# Patient Record
Sex: Female | Born: 1940 | Race: White | Hispanic: No | Smoking: Never smoker
Health system: Southern US, Community
[De-identification: ages and names within clinical notes are randomized; demographics above are authoritative.]

## PROBLEM LIST (undated history)

## (undated) DIAGNOSIS — Z950 Presence of cardiac pacemaker: Secondary | ICD-10-CM

## (undated) DIAGNOSIS — I441 Atrioventricular block, second degree: Secondary | ICD-10-CM

## (undated) DIAGNOSIS — I639 Cerebral infarction, unspecified: Secondary | ICD-10-CM

## (undated) DIAGNOSIS — R001 Bradycardia, unspecified: Secondary | ICD-10-CM

## (undated) DIAGNOSIS — Z9289 Personal history of other medical treatment: Secondary | ICD-10-CM

## (undated) HISTORY — PX: ABDOMINAL HYSTERECTOMY: SHX81

## (undated) HISTORY — DX: Cerebral infarction, unspecified: I63.9

## (undated) HISTORY — PX: CATARACT EXTRACTION W/ INTRAOCULAR LENS  IMPLANT, BILATERAL: SHX1307

---

## 2016-04-14 ENCOUNTER — Ambulatory Visit (INDEPENDENT_AMBULATORY_CARE_PROVIDER_SITE_OTHER): Payer: Medicare PPO | Admitting: Family Medicine

## 2016-04-14 ENCOUNTER — Encounter: Payer: Self-pay | Admitting: Family Medicine

## 2016-04-14 VITALS — BP 136/79 | HR 69 | Temp 96.7°F | Ht 63.0 in | Wt 191.8 lb

## 2016-04-14 DIAGNOSIS — Z1231 Encounter for screening mammogram for malignant neoplasm of breast: Secondary | ICD-10-CM | POA: Diagnosis not present

## 2016-04-14 DIAGNOSIS — R05 Cough: Secondary | ICD-10-CM

## 2016-04-14 DIAGNOSIS — R471 Dysarthria and anarthria: Secondary | ICD-10-CM | POA: Diagnosis not present

## 2016-04-14 DIAGNOSIS — R29810 Facial weakness: Secondary | ICD-10-CM | POA: Diagnosis not present

## 2016-04-14 DIAGNOSIS — N8110 Cystocele, unspecified: Secondary | ICD-10-CM

## 2016-04-14 DIAGNOSIS — Z1239 Encounter for other screening for malignant neoplasm of breast: Secondary | ICD-10-CM

## 2016-04-14 DIAGNOSIS — R059 Cough, unspecified: Secondary | ICD-10-CM
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
## 2016-04-14 NOTE — Patient Instructions (Signed)
Great to meet you!  Lets follow up in 4-6 weeks, when the MRI results are aavailable we will call  We will call with labs within 1 week.   Continue a daily aspirin

## 2016-04-15 ENCOUNTER — Other Ambulatory Visit: Payer: Medicare PPO

## 2016-04-15 DIAGNOSIS — R29810 Facial weakness: Secondary | ICD-10-CM

## 2016-04-16 LAB — CMP14+EGFR
A/G RATIO: 1.6 (ref 1.2–2.2)
ALK PHOS: 123 IU/L — AB (ref 39–117)
ALT: 14 IU/L (ref 0–32)
AST: 16 IU/L (ref 0–40)
Albumin: 4.2 g/dL (ref 3.5–4.8)
BUN/Creatinine Ratio: 15 (ref 12–28)
BUN: 12 mg/dL (ref 8–27)
Bilirubin Total: 0.7 mg/dL (ref 0.0–1.2)
CO2: 28 mmol/L (ref 18–29)
Calcium: 9.6 mg/dL (ref 8.7–10.3)
Chloride: 103 mmol/L (ref 96–106)
Creatinine, Ser: 0.8 mg/dL (ref 0.57–1.00)
GFR calc Af Amer: 84 mL/min/{1.73_m2} (ref >59)
GFR calc non Af Amer: 73 mL/min/{1.73_m2} (ref >59)
GLOBULIN, TOTAL: 2.6 g/dL (ref 1.5–4.5)
Glucose: 95 mg/dL (ref 65–99)
POTASSIUM: 4.9 mmol/L (ref 3.5–5.2)
SODIUM: 145 mmol/L — AB (ref 134–144)
Total Protein: 6.8 g/dL (ref 6.0–8.5)

## 2016-04-16 LAB — CBC WITH DIFFERENTIAL/PLATELET
BASOS ABS: 0.1 10*3/uL (ref 0.0–0.2)
Basos: 1 %
EOS (ABSOLUTE): 0.4 10*3/uL (ref 0.0–0.4)
Eos: 5 %
Hematocrit: 41.9 % (ref 34.0–46.6)
Hemoglobin: 14.3 g/dL (ref 11.1–15.9)
IMMATURE GRANULOCYTES: 0 %
Immature Grans (Abs): 0 10*3/uL (ref 0.0–0.1)
Lymphocytes Absolute: 2.2 10*3/uL (ref 0.7–3.1)
Lymphs: 26 %
MCH: 31.4 pg (ref 26.6–33.0)
MCHC: 34.1 g/dL (ref 31.5–35.7)
MCV: 92 fL (ref 79–97)
MONOS ABS: 0.6 10*3/uL (ref 0.1–0.9)
Monocytes: 7 %
NEUTROS PCT: 61 %
Neutrophils Absolute: 5.2 10*3/uL (ref 1.4–7.0)
PLATELETS: 238 10*3/uL (ref 150–379)
RBC: 4.56 x10E6/uL (ref 3.77–5.28)
RDW: 12.7 % (ref 12.3–15.4)
WBC: 8.5 10*3/uL (ref 3.4–10.8)

## 2016-04-16 LAB — LIPID PANEL
CHOL/HDL RATIO: 3.6 ratio (ref 0.0–4.4)
Cholesterol, Total: 169 mg/dL (ref 100–199)
HDL: 47 mg/dL (ref >39)
LDL CALC: 102 mg/dL — AB (ref 0–99)
TRIGLYCERIDES: 98 mg/dL (ref 0–149)
VLDL Cholesterol Cal: 20 mg/dL (ref 5–40)

## 2016-05-10 DIAGNOSIS — I639 Cerebral infarction, unspecified: Secondary | ICD-10-CM

## 2016-05-10 HISTORY — DX: Cerebral infarction, unspecified: I63.9

## 2016-05-13 ENCOUNTER — Encounter: Payer: Medicare PPO | Admitting: Obstetrics and Gynecology

## 2016-05-17 ENCOUNTER — Encounter: Payer: Self-pay | Admitting: Family Medicine

## 2016-05-17 ENCOUNTER — Ambulatory Visit (INDEPENDENT_AMBULATORY_CARE_PROVIDER_SITE_OTHER): Payer: Medicare PPO

## 2016-05-17 ENCOUNTER — Ambulatory Visit (INDEPENDENT_AMBULATORY_CARE_PROVIDER_SITE_OTHER): Payer: Medicare PPO | Admitting: Family Medicine

## 2016-05-17 VITALS — BP 140/78 | HR 64 | Temp 97.4°F | Ht 63.0 in | Wt 191.6 lb

## 2016-05-17 DIAGNOSIS — G44229 Chronic tension-type headache, not intractable: Secondary | ICD-10-CM

## 2016-05-17 DIAGNOSIS — Z0000 Encounter for general adult medical examination without abnormal findings: Secondary | ICD-10-CM | POA: Insufficient documentation

## 2016-05-17 DIAGNOSIS — T189XXA Foreign body of alimentary tract, part unspecified, initial encounter: Secondary | ICD-10-CM

## 2016-05-17 DIAGNOSIS — R29810 Facial weakness: Secondary | ICD-10-CM | POA: Diagnosis not present

## 2016-05-17 NOTE — Progress Notes (Signed)
   HPI  Patient presents today for follow-up of facial droop, headaches, blood work  Patient had an onset of right-sided facial droop during a recent stressful situation. She was concerned about stroke but waited 3 months before presenting for this symptom. She's had an MRI ordered but has not been performed yet. No additional episodes.  Patient is having headaches intermittently that come and go without treatment. Lasting less than a few hours. Described as bilateral squeezing type pain extending from the neck up at over. Associated with stress.  Patient has GYN appointment.  Her cough has improved   He should also states that she's recently lost a couple of gold teeth, she requests x-ray to check this out. Her bowels are moving normally, she's tolerating food and fluids normally. She has an appointment with her dentist.  PMH: Smoking status noted ROS: Per HPI  Objective: BP 140/78   Pulse 64   Temp 97.4 F (36.3 C) (Oral)   Ht 5\' 3"  (1.6 m)   Wt 191 lb 9.6 oz (86.9 kg)   BMI 33.94 kg/m  Gen: NAD, alert, cooperative with exam HEENT: NCAT CV: RRR, good S1/S2, no murmur Resp: CTABL, no wheezes, non-labored Ext: No edema, warm Neuro: Alert and oriented, right-sided facial droop, cranial nerves II through XII otherwise intact, strength 5/5 and sensation intact in bilateral upper and lower extremities, 2+ patellar tendon reflexes bilaterally  Assessment and plan:  # Swallowed foreign body Patient with concern for swallowing gold teeth, plain film to evaluate today, she has follow-up with dentistry Plain film does not show foreign body, discuss ed this possibility, possibly moved on.   # Facial droop Stable No other neuro findings, follow-up on MRI which has not been scheduled If prior authorization for MRI is not timely would recommend neuro evaluation No other episodes consistent with or worrisome for TIA or stroke  # Tension headaches Chronic intermittent headaches,  likely stress relatedness Discussed supportive care  # Healthcare maintenance FOBT discussed    Murtis SinkSam Bradshaw, MD [facility] 05/17/2016, 3:14 PM

## 2016-05-17 NOTE — Patient Instructions (Signed)
Great to see you!  We will work on the MRI, if it is not working out as expected we will likely send you to neurology ( a brain doctor).

## 2016-05-20 ENCOUNTER — Other Ambulatory Visit: Payer: Self-pay

## 2016-05-20 DIAGNOSIS — R29810 Facial weakness: Secondary | ICD-10-CM

## 2016-05-21 ENCOUNTER — Encounter: Payer: Medicare PPO | Admitting: Obstetrics and Gynecology

## 2016-06-01 ENCOUNTER — Ambulatory Visit (INDEPENDENT_AMBULATORY_CARE_PROVIDER_SITE_OTHER): Payer: Medicare PPO | Admitting: Family Medicine

## 2016-06-01 VITALS — BP 145/59 | HR 45 | Temp 97.1°F | Ht 63.0 in | Wt 191.0 lb

## 2016-06-01 DIAGNOSIS — I4430 Unspecified atrioventricular block: Secondary | ICD-10-CM

## 2016-06-01 DIAGNOSIS — R079 Chest pain, unspecified: Secondary | ICD-10-CM

## 2016-06-01 NOTE — Progress Notes (Signed)
Subjective:  Patient ID: Holly Chang, female    DOB: 1940-09-19  Age: 76 y.o. MRN: [id number]  CC: chest discomfort on the left side (had chest pain last saturday after eating - history of mini stroke - per pt)   HPI Holly Chang presents for A substernal twitching sensation this morning. There was no pain pressure radiation. She had some right-sided chest pain 3 days ago after eating some suspicious food. She had no exertional component. She does not have a history of exertional chest pain. There has been no radiation. She denies associated shortness of breath nausea and dyspnea. No diaphoresis. Patient states the twitching is gone now. History Holly Chang has a past medical history of Allergy and Cataract.   She has a past surgical history that includes Abdominal hysterectomy and Eye surgery.   Her family history includes Alcohol abuse in her father; Cancer in her paternal uncle; Heart disease in her father.She reports that she has never smoked. She has never used smokeless tobacco. She reports that she does not drink alcohol or use drugs.    ROS Review of Systems  Constitutional: Negative for activity change, appetite change and fever.  HENT: Negative for congestion, rhinorrhea and sore throat.   Eyes: Negative for visual disturbance.  Respiratory: Negative for cough, chest tightness and shortness of breath.   Cardiovascular: Positive for chest pain. Negative for palpitations.       See history of present illness  Gastrointestinal: Positive for abdominal pain (ongoing w/u for vaginal prolapse). Negative for diarrhea and nausea.  Genitourinary: Negative for dysuria.  Musculoskeletal: Negative for arthralgias and myalgias.    Objective:  BP (!) 145/59 (BP Location: Right Arm)   Pulse (!) 45   Temp 97.1 F (36.2 C) (Oral)   Ht 5\' 3"  (1.6 m)   Wt 191 lb (86.6 kg)   SpO2 97%   BMI 33.83 kg/m   BP Readings from Last 3 Encounters:  06/01/16 (!) 145/59  05/17/16 140/78  04/14/16  136/79    Wt Readings from Last 3 Encounters:  06/01/16 191 lb (86.6 kg)  05/17/16 191 lb 9.6 oz (86.9 kg)  04/14/16 191 lb 12.8 oz (87 kg)     Physical Exam  Constitutional: She is oriented to person, place, and time. She appears well-developed and well-nourished. She appears distressed.  HENT:  Head: Normocephalic and atraumatic.  Eyes: Conjunctivae are normal. Pupils are equal, round, and reactive to light.  Neck: Normal range of motion. Neck supple. No thyromegaly present.  Cardiovascular: Normal rate and normal heart sounds.   No murmur heard. Irregular with bradycardia   Pulmonary/Chest: Effort normal and breath sounds normal. No respiratory distress. She has no wheezes. She has no rales.  Abdominal: Soft. Bowel sounds are normal. She exhibits no distension. There is no tenderness.  Musculoskeletal: Normal range of motion.  Lymphadenopathy:    She has no cervical adenopathy.  Neurological: She is alert and oriented to person, place, and time.  Skin: Skin is warm and dry.  Psychiatric: She has a normal mood and affect. Her behavior is normal.    Patient was never admitted.  Assessment & Plan:   Holly Chang was seen today for chest discomfort on the left side.  Diagnoses and all orders for this visit:  Chest pain, unspecified type -     EKG 12-Lead -     Holter monitor - 48 hour; Future -     Ambulatory referral to Cardiology  AV block -  Holter monitor - 48 hour; Future -     Ambulatory referral to Cardiology   Symptoms are less than convincing for myocardial origin. She does have risk factors including age and some evidence for previous mini stroke. Workup for that is incomplete at this time however. She is a nonsmoker. She is not a diabetic. Blood pressure is always been good even though borderline on office checks here.  I am having Holly Chang maintain her cyanocobalamin and Vitamin D3.  Allergies as of 06/01/2016      Reactions   Penicillins Hives        Medication List       Accurate as of 06/01/16  7:43 PM. Always use your most recent med list.          cyanocobalamin 1000 MCG tablet Take 1,000 mcg by mouth daily.   Vitamin D3 1000 units Caps Take by mouth.       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 Cardiology consulted by phone. Recommends office follow-up and Holter monitor.  Follow-up: No Follow-up on file.  Mechele ClaudeWarren Zurii Hewes, M.D.

## 2016-06-04 ENCOUNTER — Other Ambulatory Visit: Payer: Self-pay | Admitting: *Deleted

## 2016-06-04 DIAGNOSIS — R079 Chest pain, unspecified: Secondary | ICD-10-CM

## 2016-06-04 DIAGNOSIS — I4430 Unspecified atrioventricular block: Secondary | ICD-10-CM

## 2016-06-07 ENCOUNTER — Encounter: Payer: Self-pay | Admitting: Cardiology

## 2016-06-07 ENCOUNTER — Ambulatory Visit (INDEPENDENT_AMBULATORY_CARE_PROVIDER_SITE_OTHER): Payer: Medicare PPO | Admitting: Cardiology

## 2016-06-07 VITALS — BP 135/87 | HR 62 | Ht 63.0 in | Wt 191.6 lb

## 2016-06-07 DIAGNOSIS — I459 Conduction disorder, unspecified: Secondary | ICD-10-CM

## 2016-06-07 DIAGNOSIS — R079 Chest pain, unspecified: Secondary | ICD-10-CM | POA: Diagnosis not present

## 2016-06-07 DIAGNOSIS — I441 Atrioventricular block, second degree: Secondary | ICD-10-CM

## 2016-06-07 LAB — COMPREHENSIVE METABOLIC PANEL
ALK PHOS: 91 U/L (ref 33–130)
ALT: 14 U/L (ref 6–29)
AST: 16 U/L (ref 10–35)
Albumin: 4.1 g/dL (ref 3.6–5.1)
BILIRUBIN TOTAL: 1.1 mg/dL (ref 0.2–1.2)
BUN: 13 mg/dL (ref 7–25)
CO2: 27 mmol/L (ref 20–31)
Calcium: 9.7 mg/dL (ref 8.6–10.4)
Chloride: 106 mmol/L (ref 98–110)
Creat: 0.85 mg/dL (ref 0.60–0.93)
Glucose, Bld: 85 mg/dL (ref 65–99)
Potassium: 4.6 mmol/L (ref 3.5–5.3)
SODIUM: 141 mmol/L (ref 135–146)
Total Protein: 6.9 g/dL (ref 6.1–8.1)

## 2016-06-07 LAB — TSH: TSH: 1.06 mIU/L
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
## 2016-06-07 NOTE — Patient Instructions (Signed)
Medication Instructions:  Your physician recommends that you continue on your current medications as directed. Please refer to the Current Medication list given to you today.  If you need a refill on your cardiac medications before your next appointment, please call your pharmacy.  Labwork: CMP,MAG,TSH TODAY AT [facility] ON THE 1ST FLOOR  Testing/Procedures: Your physician has requested that you have a exercise myoview. For further information please visit [email or web address]. Please follow instruction sheet, as given.  Follow-Up: Your physician recommends that you schedule a follow-up appointment in: 3-4 weeks after stresstest   Thank you for choosing [facility] at [facility]!!    Ahmani Daoud, LPN Nada BoozerLaura Ingold, NP

## 2016-06-08 ENCOUNTER — Telehealth: Payer: Self-pay

## 2016-06-08 LAB — MAGNESIUM: Magnesium: 2.2 mg/dL (ref 1.5–2.5)

## 2016-06-08 NOTE — Telephone Encounter (Signed)
I agree, will follow up on MRI already ordered.   Murtis SinkSam Hortensia Duffin, MD [facility] 06/08/2016, 3:49 PM

## 2016-06-08 NOTE — Telephone Encounter (Signed)
Pt's insurance will not approve the MRI to be done here. Her PCP is located in [place] and until she  changes her PCP, we cannot get approval for the MRI. Also referral placed for [facility]. Multiple messages left with patient to return call or make an appointment. Pt has yet to do this

## 2016-06-08 NOTE — Telephone Encounter (Signed)
Patient states that Nada BoozerLaura Ingold, NP recommends that patient gets a MRI asap.

## 2016-06-08 NOTE — Telephone Encounter (Signed)
Patient states that Laura Ingold, NP recommends that patient gets a MRI asap.  

## 2016-06-09 ENCOUNTER — Telehealth (HOSPITAL_COMMUNITY): Payer: Self-pay

## 2016-06-09 NOTE — Telephone Encounter (Signed)
Encounter complete. 

## 2016-06-11 ENCOUNTER — Ambulatory Visit (HOSPITAL_COMMUNITY)
Admission: RE | Admit: 2016-06-11 | Discharge: 2016-06-11 | Disposition: A | Discharge status: Home / Self Care | Payer: Medicare PPO | Source: Ambulatory Visit | Attending: Cardiology | Admitting: Cardiology

## 2016-06-11 DIAGNOSIS — Z8249 Family history of ischemic heart disease and other diseases of the circulatory system: Secondary | ICD-10-CM | POA: Insufficient documentation

## 2016-06-11 DIAGNOSIS — R079 Chest pain, unspecified: Secondary | ICD-10-CM | POA: Insufficient documentation

## 2016-06-11 LAB — MYOCARDIAL PERFUSION IMAGING
CHL CUP NUCLEAR SSS: 3
LV dias vol: 79 mL (ref 46–106)
LVSYSVOL: 32 mL
Peak HR: 97 {beats}/min
Rest HR: 64 {beats}/min
SDS: 0
SRS: 3
TID: 1.3

## 2016-06-11 MED ORDER — REGADENOSON 0.4 MG/5ML IV SOLN
0.4000 mg | Freq: Once | INTRAVENOUS | Status: AC
  Administered 2016-06-11: 0.4 mg via INTRAVENOUS

## 2016-06-11 MED ORDER — TECHNETIUM TC 99M TETROFOSMIN IV KIT
9.5000 | PACK | Freq: Once | INTRAVENOUS | Status: AC | PRN
  Administered 2016-06-11: 9.5 via INTRAVENOUS
  Filled 2016-06-11: qty 10

## 2016-06-11 MED ORDER — AMINOPHYLLINE 25 MG/ML IV SOLN
75.0000 mg | Freq: Once | INTRAVENOUS | Status: AC
  Administered 2016-06-11: 75 mg via INTRAVENOUS

## 2016-06-11 MED ORDER — TECHNETIUM TC 99M TETROFOSMIN IV KIT
32.2000 | PACK | Freq: Once | INTRAVENOUS | Status: AC | PRN
  Administered 2016-06-11: 32.2 via INTRAVENOUS
  Filled 2016-06-11: qty 33

## 2016-06-21 ENCOUNTER — Ambulatory Visit: Payer: Self-pay | Admitting: Cardiology

## 2016-06-24 ENCOUNTER — Ambulatory Visit (INDEPENDENT_AMBULATORY_CARE_PROVIDER_SITE_OTHER): Payer: Medicare PPO | Admitting: Family Medicine

## 2016-06-24 ENCOUNTER — Ambulatory Visit (INDEPENDENT_AMBULATORY_CARE_PROVIDER_SITE_OTHER): Payer: Medicare PPO

## 2016-06-24 VITALS — BP 126/74 | HR 42 | Temp 98.7°F | Ht 63.0 in | Wt 191.0 lb

## 2016-06-24 DIAGNOSIS — R0602 Shortness of breath: Secondary | ICD-10-CM

## 2016-06-24 DIAGNOSIS — R29810 Facial weakness: Secondary | ICD-10-CM | POA: Diagnosis not present

## 2016-06-24 DIAGNOSIS — R42 Dizziness and giddiness: Secondary | ICD-10-CM

## 2016-06-24 NOTE — Progress Notes (Signed)
   HPI  Patient presents today for follow-up of recent illness.  Patient does complain of difficulty getting a full deep breath for about one week. She denies any fever, chills, sweats, or severe cough.  Patient had an onset of right-sided facial droop a few months ago, we attempted to send her for MRI, it turns out there is very much difficulty with her insurance and we could not get this prior authorized. We then tried to send her to neurology, she did not answer her phone for appointments. She states that her phone is not working correctly.  Today the patient has called and changed her insurance while she is in the clinic, we are now considered in network and her MRI has been set up.  In the last 2 weeks she has developed what she describes as "vertigo". She describes dizziness type symptoms that, on for a few minutes and go away. She denies any room spinning sensation or worsening with head turning.  Her right-sided facial droop has not changed, she denies any new weakness, numbness, or tingling.   PMH: Smoking status noted ROS: Per HPI  Objective: BP 126/74 (BP Location: Left Arm)   Pulse (!) 42   Temp 98.7 F (37.1 C) (Oral)   Ht 5\' 3"  (1.6 m)   Wt 191 lb (86.6 kg)   BMI 33.83 kg/m  Gen: NAD, alert, cooperative with exam HEENT: NCAT, right-sided facial droop, flattening of the nasolabial fold on the right side, normal TMs bilaterally CV: RRR, good S1/S2, no murmur Resp: CTABL, no wheezes, non-labored Ext: No edema, warm Neuro: Alert and oriented, right-sided facial droop as above  Chest x-ray today is clear  Assessment and plan:  # Shortness of breath Chest x-ray is clear today, her lung exam is reassuring Reassurance provided, low threshold for follow-up if she develops any persistent cough, fever, or worsening symptoms.  # Facial droop I am very concerned about underlying stroke within the last 2 months. Her dizziness is intermittent and so not clearly associated  with possible stroke. With her insurance changes we have arranged an MRI for tomorrow night and neurology for Monday morning.  # Dizziness Unclear etiology, momentary to a few minutes of unsteadiness type symptom with no room spinning or head turning worsening. Considering facial droop and possible stroke this should be ruled out before extensive other evaluation is undergone.    Orders Placed This Encounter  Procedures  . DG Chest 2 View    Standing Status:   Future    Number of Occurrences:   1    Standing Expiration Date:   08/22/2017    Order Specific Question:   Reason for Exam (SYMPTOM  OR DIAGNOSIS REQUIRED)    Answer:   shortness of breath    Order Specific Question:   Preferred imaging location?    Answer:   Internal    No orders of the defined types were placed in this encounter.   Murtis SinkSam Malon Siddall, MD [facility] 06/24/2016, 11:56 AM

## 2016-06-24 NOTE — Patient Instructions (Signed)
Great to see you!  We are working on the MRI, if it does not work out I think the emergency room is our next step given your dizziness

## 2016-06-25 ENCOUNTER — Ambulatory Visit (HOSPITAL_COMMUNITY)
Admission: RE | Admit: 2016-06-25 | Discharge: 2016-06-25 | Disposition: A | Discharge status: Home / Self Care | Payer: Medicare PPO | Source: Ambulatory Visit | Attending: Family Medicine | Admitting: Family Medicine

## 2016-06-25 DIAGNOSIS — J3489 Other specified disorders of nose and nasal sinuses: Secondary | ICD-10-CM | POA: Insufficient documentation

## 2016-06-25 DIAGNOSIS — R29810 Facial weakness: Secondary | ICD-10-CM

## 2016-06-25 DIAGNOSIS — M2548 Effusion, other site: Secondary | ICD-10-CM | POA: Diagnosis not present

## 2016-06-28 ENCOUNTER — Ambulatory Visit (INDEPENDENT_AMBULATORY_CARE_PROVIDER_SITE_OTHER): Payer: Medicare PPO | Admitting: Neurology

## 2016-06-28 ENCOUNTER — Encounter: Payer: Self-pay | Admitting: Neurology

## 2016-06-28 VITALS — BP 137/76 | HR 63 | Ht 63.0 in | Wt 194.6 lb

## 2016-06-28 DIAGNOSIS — F419 Anxiety disorder, unspecified: Secondary | ICD-10-CM

## 2016-06-28 DIAGNOSIS — R29810 Facial weakness: Secondary | ICD-10-CM | POA: Diagnosis not present

## 2016-06-28 NOTE — Patient Instructions (Addendum)
-   will do MRA and carotid ultrasound to finish stroke work up - continue to follow up with cardiology - Follow up with your primary care physician as scheduled - your MRI showed no stroke and I think many symptoms you have are likely due to stress, anxiety, and depression.  - relaxation and de-stress are the key to fee better.  - recommend psychology referral if needed.  - may consider to discuss with PCP for ENT referral for right mastoid effusion.  - follow up in 2 months.

## 2016-06-29 DIAGNOSIS — F419 Anxiety disorder, unspecified: Secondary | ICD-10-CM | POA: Insufficient documentation
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
## 2016-06-30 ENCOUNTER — Telehealth: Payer: Self-pay | Admitting: Neurology

## 2016-06-30 NOTE — Telephone Encounter (Signed)
I left a voicemail for patient to call back to schedule Dopplar.

## 2016-07-05 ENCOUNTER — Ambulatory Visit (HOSPITAL_COMMUNITY)
Admission: RE | Admit: 2016-07-05 | Discharge: 2016-07-05 | Disposition: A | Discharge status: Home / Self Care | Payer: Medicare PPO | Source: Ambulatory Visit | Attending: Neurology | Admitting: Neurology

## 2016-07-05 DIAGNOSIS — R29810 Facial weakness: Secondary | ICD-10-CM | POA: Insufficient documentation

## 2016-07-07 ENCOUNTER — Telehealth: Payer: Self-pay

## 2016-07-07 NOTE — Telephone Encounter (Signed)
LEft vm for patient to call back about MRi brain results.

## 2016-07-07 NOTE — Telephone Encounter (Signed)
-----   Message from Marvel PlanJindong Xu, MD sent at 07/06/2016 10:43 PM EST ----- Could you please let the patient know that the MRI brain vessel test done recently was negative for brain vessel abnormalities. Please continue current treatment. Thanks.  Marvel PlanJindong Xu, MD PhD Stroke Neurology 07/06/2016 10:43 PM

## 2016-07-08 NOTE — Telephone Encounter (Signed)
LEft vm for patient to call back about results. 

## 2016-07-08 NOTE — Telephone Encounter (Signed)
Pt returned RN's call °
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
## 2016-07-10 NOTE — Telephone Encounter (Signed)
Noted. Thank you.  Marvel PlanJindong Yutaka Holberg, MD PhD Stroke Neurology 07/10/2016 10:21 AM

## 2016-07-12 NOTE — Telephone Encounter (Signed)
Patient called back and she is rescheduled for 07/22/2016 arrive at 11:15 am for 11:30 apt. Carotid Doppler.

## 2016-07-22 ENCOUNTER — Ambulatory Visit: Payer: Medicare PPO

## 2016-07-27 ENCOUNTER — Ambulatory Visit (INDEPENDENT_AMBULATORY_CARE_PROVIDER_SITE_OTHER): Payer: Medicare PPO | Admitting: Cardiovascular Disease

## 2016-07-27 ENCOUNTER — Encounter: Payer: Self-pay | Admitting: Cardiovascular Disease

## 2016-07-27 VITALS — BP 122/68 | HR 80 | Ht 63.0 in | Wt 189.0 lb

## 2016-07-27 DIAGNOSIS — I441 Atrioventricular block, second degree: Secondary | ICD-10-CM | POA: Diagnosis not present

## 2016-07-27 DIAGNOSIS — R072 Precordial pain: Secondary | ICD-10-CM | POA: Diagnosis not present

## 2016-07-27 NOTE — Progress Notes (Signed)
Cardiology Office Note    Date:  07/31/2016   ID:  Holly Chang, DOB 22-Dec-1940, MRN [id number]  PCP:  Kevin Fenton, MD  Cardiologist:   Thurmon Fair, MD   Chief Complaint  Patient presents with  . Appointment    has had some rigth side body and chest discomfort and pains. SHOB, helps to stop and rest. Normal tiredness. no swelling    History of Present Illness:  Holly Chang is a 76 y.o. female presenting in follow-up after workup for chest pain. When she saw Nada Boozer in January her complaint was right-sided chest pain on two occasions and left-sided chest pain on another occasion. Since then she again has had 1 episodes of "very painful" sensation over her entire right side from chest to abdomen to hip.. This happened about 3 days ago at rest and resolves spontaneously. It was hard to get out of it. She denies exertional chest pain or dyspnea. She has not had syncope or palpitations, but has noticed what she calls "vertigo" over the last 2 weeks. After standing up she staggers for a few steps. She has not fallen. She denies focal neurological complaints.   She underwent a Lexiscan Myoview study January 26 was a normal study. She also wore a 24-hour Holter monitor that showed normal sinus rhythm and first-degree AV block. She had occasional periods of 2:1 AV block, but the longest pause was under 2 seconds in duration. Her electrocardiogram on January 29 shows sinus rhythm with incomplete left bundle branch block (QRS 106 ms), QTC 416 ms    Past Medical History:  Diagnosis Date  . Allergy   . Cataract    bilateral  . Stroke [facility])     Past Surgical History:  Procedure Laterality Date  . ABDOMINAL HYSTERECTOMY    . EYE SURGERY     bilateral cataracts    Current Medications: Outpatient Medications Prior to Visit  Medication Sig Dispense Refill  . Cholecalciferol (VITAMIN D3) 1000 units CAPS Take 1,000 Units by mouth daily.     . cyanocobalamin 1000 MCG tablet Take  1,000 mcg by mouth daily.     No facility-administered medications prior to visit.      Allergies:   Penicillins   Social History   Social History  . Marital status: Single    Spouse name: N/A  . Number of children: N/A  . Years of education: N/A   Social History Main Topics  . Smoking status: Never Smoker  . Smokeless tobacco: Never Used  . Alcohol use No  . Drug use: No  . Sexual activity: Not Asked   Other Topics Concern  . None   Social History Narrative  . None     Family History:  The patient's family history includes Alcohol abuse in her father; Cancer in her paternal uncle; Heart disease in her father; Pulmonary fibrosis in her mother.   ROS:   Please see the history of present illness.    ROS All other systems reviewed and are negative.   PHYSICAL EXAM:   VS:  BP 122/68   Pulse 80   Ht 5\' 3"  (1.6 m)   Wt 85.7 kg (189 lb)   SpO2 96%   BMI 33.48 kg/m    GEN: Well nourished, well developed, in no acute distress  HEENT: normal  Neck: no JVD, carotid bruits, or masses Cardiac: RRR; no murmurs, rubs, or gallops,no edema  Respiratory:  clear to auscultation bilaterally, normal work of breathing GI: soft,  nontender, nondistended, + BS MS: no deformity or atrophy  Skin: warm and dry, no rash Neuro:  Alert and Oriented x 3, Strength and sensation are intact Psych: euthymic mood, full affect  Wt Readings from Last 3 Encounters:  07/27/16 85.7 kg (189 lb)  06/28/16 88.3 kg (194 lb 9.6 oz)  06/24/16 86.6 kg (191 lb)      Studies/Labs Reviewed:   EKG:  EKG is not ordered today  Recent Labs: 04/15/2016: Platelets 238 06/07/2016: ALT 14; BUN 13; Creat 0.85; Magnesium 2.2; Potassium 4.6; Sodium 141; TSH 1.06   Lipid Panel    Component Value Date/Time   CHOL 169 04/15/2016 0945   TRIG 98 04/15/2016 0945   HDL 47 04/15/2016 0945   CHOLHDL 3.6 04/15/2016 0945   LDLCALC 102 (H) 04/15/2016 0945    Additional studies/ records that were reviewed today  include:  Notes from Dr. Roda ShuttersXu. Imaging studies: Normal MRI of brain, followed by virtually normal MRA (right PCA branch vessel attenuation).    ASSESSMENT:    1. Precordial pain   2. Second degree atrioventricular block, Mobitz type I      PLAN:  In order of problems listed above:  1. Chest pain: This does not have any features to suggest that is coronary in etiology. It occurs at rest, is positional and varies in location. Other than age and tobacco use, her risk factors for coronary disease are quite benign. She had a reassuring nuclear stress test. 2. 2nd deg AVB, Mobitz I: She has some very subtle findings to suggest infrahisian disease. She does not have symptoms to suggest bradycardia. Her monitor showed a maximum pause of 1.9 seconds. No indication for pacemaker therapy at this time. Would avoid medications with negative chronotropic effect.    Medication Adjustments/Labs and Tests Ordered: Current medicines are reviewed at length with the patient today.  Concerns regarding medicines are outlined above.  Medication changes, Labs and Tests ordered today are listed in the Patient Instructions below. Patient Instructions  Dr Royann Shiversroitoru recommends that you schedule a follow-up appointment in 1 year. You will receive a reminder letter in the mail two months in advance. If you don't receive a letter, please call our office to schedule the follow-up appointment.  If you need a refill on your cardiac medications before your next appointment, please call your pharmacy.  Please call the office if you pass out or have any fainting spells or symptoms.     Signed, Thurmon FairMihai Donelle Baba, MD  07/31/2016 1:03 PM    [facility] [address], [place], [place]  [address] Phone: [phone]; Fax: [phone]

## 2016-07-27 NOTE — Patient Instructions (Signed)
Dr Royann Shiversroitoru recommends that you schedule a follow-up appointment in 1 year. You will receive a reminder letter in the mail two months in advance. If you don't receive a letter, please call our office to schedule the follow-up appointment.  If you need a refill on your cardiac medications before your next appointment, please call your pharmacy.  Please call the office if you pass out or have any fainting spells or symptoms.

## 2016-07-28 ENCOUNTER — Other Ambulatory Visit: Payer: Medicare PPO

## 2016-07-31 DIAGNOSIS — I441 Atrioventricular block, second degree: Secondary | ICD-10-CM | POA: Insufficient documentation

## 2016-08-04 ENCOUNTER — Ambulatory Visit (INDEPENDENT_AMBULATORY_CARE_PROVIDER_SITE_OTHER): Payer: Medicare PPO

## 2016-08-04 DIAGNOSIS — R29810 Facial weakness: Secondary | ICD-10-CM

## 2016-08-18 ENCOUNTER — Telehealth: Payer: Self-pay

## 2016-08-18 NOTE — Telephone Encounter (Signed)
LEft vm for patient to call back about her carotid artery ultrasound results.

## 2016-08-18 NOTE — Telephone Encounter (Signed)
-----   Message from Marvel Plan, MD sent at 08/17/2016 10:54 PM EDT ----- Could you please let the patient know that the carotid artery ultrasound done 2 weeks ago was negative for any carotid narrowing. It is a normal study. No concerns raised. Please continue current treatment. Thanks.  Marvel Plan, MD PhD Stroke Neurology 08/17/2016 10:54 PM

## 2016-08-19 ENCOUNTER — Telehealth: Payer: Self-pay

## 2016-08-19 NOTE — Telephone Encounter (Signed)
LEft second vm for patient to call back about carotid results.

## 2016-08-19 NOTE — Telephone Encounter (Signed)
RN receive incoming call from patient. Rn stated her carotid artery ultrasound was negative for any carotid narrowing, a normal study. Continue treatment plan.Pt verbalized understanding.

## 2016-08-19 NOTE — Telephone Encounter (Signed)
Dr.Xu FYI see note thanks.
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
## 2016-08-21 NOTE — Telephone Encounter (Signed)
Noted, but weird conversation. Thanks.  Marvel Plan, MD PhD Stroke Neurology 08/21/2016 11:53 AM

## 2016-08-30 ENCOUNTER — Ambulatory Visit: Payer: Medicare PPO | Admitting: Nurse Practitioner

## 2016-08-31 ENCOUNTER — Encounter: Payer: Self-pay | Admitting: Nurse Practitioner

## 2016-09-06 ENCOUNTER — Encounter: Payer: Medicare PPO | Admitting: *Deleted

## 2016-09-30 ENCOUNTER — Telehealth: Payer: Self-pay

## 2016-09-30 ENCOUNTER — Ambulatory Visit: Payer: Medicare PPO | Admitting: Nurse Practitioner

## 2016-09-30 NOTE — Telephone Encounter (Signed)
PATIENT NO SHOW FOR APPT TODAY. 

## 2016-10-20 ENCOUNTER — Ambulatory Visit (INDEPENDENT_AMBULATORY_CARE_PROVIDER_SITE_OTHER): Payer: Medicare PPO | Admitting: Nurse Practitioner

## 2016-10-20 ENCOUNTER — Encounter: Payer: Self-pay | Admitting: Nurse Practitioner

## 2016-10-20 VITALS — BP 148/81 | HR 67 | Wt 192.8 lb

## 2016-10-20 DIAGNOSIS — F419 Anxiety disorder, unspecified: Secondary | ICD-10-CM | POA: Diagnosis not present

## 2016-10-20 DIAGNOSIS — R42 Dizziness and giddiness: Secondary | ICD-10-CM

## 2016-10-20 DIAGNOSIS — R29810 Facial weakness: Secondary | ICD-10-CM | POA: Diagnosis not present

## 2016-10-20 NOTE — Patient Instructions (Signed)
All studies to rule out stroke were negative Suggest you increase fluid intake dizziness could be dehydration with limited intake Follow up with your PCP as scheduled  relaxation and de-stress are the key to feeling  better. Get back to exercise by walking - recommend psychology referral if needed. For anxiety Discharge from stroke clinic-
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
## 2016-10-25 ENCOUNTER — Encounter: Payer: Self-pay | Admitting: Family Medicine

## 2016-10-25 ENCOUNTER — Ambulatory Visit: Payer: Self-pay | Admitting: Family Medicine

## 2016-10-25 ENCOUNTER — Telehealth: Payer: Self-pay | Admitting: Family Medicine

## 2016-10-25 ENCOUNTER — Ambulatory Visit (INDEPENDENT_AMBULATORY_CARE_PROVIDER_SITE_OTHER): Payer: Medicare PPO | Admitting: Family Medicine

## 2016-10-25 VITALS — BP 132/74 | HR 67 | Temp 97.0°F | Ht 63.0 in | Wt 192.0 lb

## 2016-10-25 DIAGNOSIS — B351 Tinea unguium: Secondary | ICD-10-CM

## 2016-10-25 DIAGNOSIS — R42 Dizziness and giddiness: Secondary | ICD-10-CM | POA: Diagnosis not present

## 2016-10-25 DIAGNOSIS — F419 Anxiety disorder, unspecified: Secondary | ICD-10-CM

## 2016-10-25 MED ORDER — TERBINAFINE HCL 250 MG PO TABS: 250.0000 mg | ORAL_TABLET | Freq: Every day | ORAL | 2 refills | Status: DC

## 2016-10-25 NOTE — Patient Instructions (Addendum)
Great to see you!  I have sent terbinafine to the pharmacy to treat your thickened toenails, I would like to follow up in 6 weeks to look again and repeat your labs   Please consider anxiety medications, I owuld consider starting you on lexapro
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
## 2017-03-28 ENCOUNTER — Encounter: Payer: Self-pay | Admitting: Family Medicine

## 2017-03-28 ENCOUNTER — Ambulatory Visit (INDEPENDENT_AMBULATORY_CARE_PROVIDER_SITE_OTHER): Payer: Medicare PPO | Admitting: Family Medicine

## 2017-03-28 ENCOUNTER — Ambulatory Visit (INDEPENDENT_AMBULATORY_CARE_PROVIDER_SITE_OTHER): Payer: Medicare PPO

## 2017-03-28 VITALS — BP 123/76 | HR 75 | Temp 97.1°F | Ht 63.0 in | Wt 183.8 lb

## 2017-03-28 DIAGNOSIS — B351 Tinea unguium: Secondary | ICD-10-CM | POA: Diagnosis not present

## 2017-03-28 DIAGNOSIS — R059 Cough, unspecified: Secondary | ICD-10-CM

## 2017-03-28 DIAGNOSIS — R1030 Lower abdominal pain, unspecified: Secondary | ICD-10-CM

## 2017-03-28 DIAGNOSIS — R05 Cough: Secondary | ICD-10-CM | POA: Diagnosis not present

## 2017-03-28 MED ORDER — AZITHROMYCIN 250 MG PO TABS: ORAL_TABLET | ORAL | 0 refills | Status: DC
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
## 2017-03-28 NOTE — Addendum Note (Signed)
Addended by: Elenora GammaBRADSHAW, Darivs Lunden L on: 03/28/2017 09:31 AM   Modules accepted: Orders

## 2017-03-29 ENCOUNTER — Other Ambulatory Visit: Payer: Self-pay

## 2017-03-29 DIAGNOSIS — E875 Hyperkalemia: Secondary | ICD-10-CM

## 2017-03-29 LAB — LIPID PANEL
CHOL/HDL RATIO: 2.9 ratio (ref 0.0–4.4)
Cholesterol, Total: 148 mg/dL (ref 100–199)
HDL: 51 mg/dL (ref >39)
LDL CALC: 87 mg/dL (ref 0–99)
TRIGLYCERIDES: 48 mg/dL (ref 0–149)
VLDL Cholesterol Cal: 10 mg/dL (ref 5–40)

## 2017-03-29 LAB — CBC WITH DIFFERENTIAL/PLATELET
BASOS: 0 %
Basophils Absolute: 0 10*3/uL (ref 0.0–0.2)
EOS (ABSOLUTE): 0.1 10*3/uL (ref 0.0–0.4)
EOS: 1 %
HEMATOCRIT: 41.1 % (ref 34.0–46.6)
HEMOGLOBIN: 14 g/dL (ref 11.1–15.9)
IMMATURE GRANULOCYTES: 0 %
Immature Grans (Abs): 0 10*3/uL (ref 0.0–0.1)
LYMPHS ABS: 1.6 10*3/uL (ref 0.7–3.1)
Lymphs: 20 %
MCH: 31.3 pg (ref 26.6–33.0)
MCHC: 34.1 g/dL (ref 31.5–35.7)
MCV: 92 fL (ref 79–97)
MONOCYTES: 6 %
Monocytes Absolute: 0.5 10*3/uL (ref 0.1–0.9)
Neutrophils Absolute: 5.7 10*3/uL (ref 1.4–7.0)
Neutrophils: 73 %
Platelets: 222 10*3/uL (ref 150–379)
RBC: 4.47 x10E6/uL (ref 3.77–5.28)
RDW: 13.7 % (ref 12.3–15.4)
WBC: 7.9 10*3/uL (ref 3.4–10.8)

## 2017-03-29 LAB — CMP14+EGFR
ALBUMIN: 4.1 g/dL (ref 3.5–4.8)
ALK PHOS: 127 IU/L — AB (ref 39–117)
ALT: 15 IU/L (ref 0–32)
AST: 19 IU/L (ref 0–40)
Albumin/Globulin Ratio: 1.5 (ref 1.2–2.2)
BUN / CREAT RATIO: 16 (ref 12–28)
BUN: 14 mg/dL (ref 8–27)
Bilirubin Total: 1.1 mg/dL (ref 0.0–1.2)
CALCIUM: 10 mg/dL (ref 8.7–10.3)
CO2: 25 mmol/L (ref 20–29)
CREATININE: 0.87 mg/dL (ref 0.57–1.00)
Chloride: 106 mmol/L (ref 96–106)
GFR calc Af Amer: 75 mL/min/{1.73_m2} (ref >59)
GFR, EST NON AFRICAN AMERICAN: 65 mL/min/{1.73_m2} (ref >59)
GLOBULIN, TOTAL: 2.8 g/dL (ref 1.5–4.5)
GLUCOSE: 90 mg/dL (ref 65–99)
Potassium: 5.6 mmol/L — ABNORMAL HIGH (ref 3.5–5.2)
Sodium: 142 mmol/L (ref 134–144)
Total Protein: 6.9 g/dL (ref 6.0–8.5)

## 2017-04-11 ENCOUNTER — Ambulatory Visit: Payer: Medicare PPO

## 2017-04-11 DIAGNOSIS — E875 Hyperkalemia: Secondary | ICD-10-CM

## 2017-04-12 LAB — BMP8+EGFR
BUN / CREAT RATIO: 17 (ref 12–28)
BUN: 14 mg/dL (ref 8–27)
CHLORIDE: 106 mmol/L (ref 96–106)
CO2: 24 mmol/L (ref 20–29)
Calcium: 9.3 mg/dL (ref 8.7–10.3)
Creatinine, Ser: 0.81 mg/dL (ref 0.57–1.00)
GFR, EST AFRICAN AMERICAN: 82 mL/min/{1.73_m2} (ref >59)
GFR, EST NON AFRICAN AMERICAN: 71 mL/min/{1.73_m2} (ref >59)
Glucose: 123 mg/dL — ABNORMAL HIGH (ref 65–99)
Potassium: 4.1 mmol/L (ref 3.5–5.2)
Sodium: 143 mmol/L (ref 134–144)

## 2017-07-05 ENCOUNTER — Ambulatory Visit: Payer: Medicare PPO | Admitting: Family Medicine

## 2017-07-06 ENCOUNTER — Encounter: Payer: Self-pay | Admitting: Family Medicine

## 2017-07-08 ENCOUNTER — Ambulatory Visit (INDEPENDENT_AMBULATORY_CARE_PROVIDER_SITE_OTHER): Payer: Medicare PPO | Admitting: Family Medicine

## 2017-07-08 ENCOUNTER — Encounter: Payer: Self-pay | Admitting: Family Medicine

## 2017-07-08 VITALS — BP 150/85 | HR 72 | Temp 96.8°F | Ht 63.0 in | Wt 191.2 lb

## 2017-07-08 DIAGNOSIS — F418 Other specified anxiety disorders: Secondary | ICD-10-CM | POA: Diagnosis not present

## 2017-07-08 DIAGNOSIS — R040 Epistaxis: Secondary | ICD-10-CM | POA: Diagnosis not present

## 2017-07-08 DIAGNOSIS — R0789 Other chest pain: Secondary | ICD-10-CM | POA: Diagnosis not present

## 2017-07-08 NOTE — Patient Instructions (Signed)
Great to see you!  Come back in 3-4 months unless you need us sooner.   I recommend that you call today to get an appointment with cardiology.
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
## 2017-07-09 LAB — CMP14+EGFR
A/G RATIO: 1.5 (ref 1.2–2.2)
ALT: 22 IU/L (ref 0–32)
AST: 23 IU/L (ref 0–40)
Albumin: 4.1 g/dL (ref 3.5–4.8)
Alkaline Phosphatase: 120 IU/L — ABNORMAL HIGH (ref 39–117)
BILIRUBIN TOTAL: 0.7 mg/dL (ref 0.0–1.2)
BUN/Creatinine Ratio: 18 (ref 12–28)
BUN: 16 mg/dL (ref 8–27)
CHLORIDE: 104 mmol/L (ref 96–106)
CO2: 26 mmol/L (ref 20–29)
Calcium: 9.7 mg/dL (ref 8.7–10.3)
Creatinine, Ser: 0.89 mg/dL (ref 0.57–1.00)
GFR calc Af Amer: 73 mL/min/{1.73_m2} (ref >59)
GFR calc non Af Amer: 63 mL/min/{1.73_m2} (ref >59)
Globulin, Total: 2.8 g/dL (ref 1.5–4.5)
Glucose: 81 mg/dL (ref 65–99)
POTASSIUM: 4.3 mmol/L (ref 3.5–5.2)
Sodium: 143 mmol/L (ref 134–144)
Total Protein: 6.9 g/dL (ref 6.0–8.5)

## 2017-07-09 LAB — CBC WITH DIFFERENTIAL/PLATELET
BASOS ABS: 0 10*3/uL (ref 0.0–0.2)
BASOS: 1 %
EOS (ABSOLUTE): 0.2 10*3/uL (ref 0.0–0.4)
Eos: 3 %
Hematocrit: 40.3 % (ref 34.0–46.6)
Hemoglobin: 13.5 g/dL (ref 11.1–15.9)
IMMATURE GRANULOCYTES: 0 %
Immature Grans (Abs): 0 10*3/uL (ref 0.0–0.1)
Lymphocytes Absolute: 1.8 10*3/uL (ref 0.7–3.1)
Lymphs: 23 %
MCH: 30.5 pg (ref 26.6–33.0)
MCHC: 33.5 g/dL (ref 31.5–35.7)
MCV: 91 fL (ref 79–97)
MONOS ABS: 0.6 10*3/uL (ref 0.1–0.9)
Monocytes: 8 %
NEUTROS ABS: 4.9 10*3/uL (ref 1.4–7.0)
NEUTROS PCT: 65 %
Platelets: 201 10*3/uL (ref 150–379)
RBC: 4.43 x10E6/uL (ref 3.77–5.28)
RDW: 13.7 % (ref 12.3–15.4)
WBC: 7.6 10*3/uL (ref 3.4–10.8)

## 2017-07-09 LAB — LIPID PANEL
CHOLESTEROL TOTAL: 175 mg/dL (ref 100–199)
Chol/HDL Ratio: 3 ratio (ref 0.0–4.4)
HDL: 58 mg/dL (ref >39)
LDL Calculated: 93 mg/dL (ref 0–99)
TRIGLYCERIDES: 120 mg/dL (ref 0–149)
VLDL Cholesterol Cal: 24 mg/dL (ref 5–40)

## 2017-07-11 ENCOUNTER — Encounter: Payer: Self-pay | Admitting: Family Medicine

## 2017-07-20 ENCOUNTER — Telehealth: Payer: Self-pay | Admitting: Family Medicine

## 2017-07-20 DIAGNOSIS — R0789 Other chest pain: Secondary | ICD-10-CM

## 2017-07-20 NOTE — Telephone Encounter (Signed)
Patient states that when she seen Dr. Ermalinda MemosBradshaw on 3/1 they talked about her getting a referral to Cardiology.  Requesting a referral and would like to go see Dr. Jens Somrenshaw. Please advise

## 2017-07-21 NOTE — Telephone Encounter (Signed)
Patient was complaining of chest pain that was felt to be noncardiac, however she has had work-up for chest pain about 1 year ago.  Will refer back to cardiology.    Murtis SinkSam Manvir Prabhu, MD [facility] 07/21/2017, 7:41 AM

## 2017-07-21 NOTE — Telephone Encounter (Signed)
Patient aware that referral to Cardiology has been placed.

## 2017-08-19 ENCOUNTER — Encounter: Payer: Self-pay | Admitting: Family Medicine

## 2017-08-19 ENCOUNTER — Ambulatory Visit (INDEPENDENT_AMBULATORY_CARE_PROVIDER_SITE_OTHER): Payer: Medicare PPO | Admitting: Family Medicine

## 2017-08-19 VITALS — BP 114/69 | HR 68 | Temp 96.9°F | Ht 63.0 in | Wt 191.0 lb

## 2017-08-19 DIAGNOSIS — F419 Anxiety disorder, unspecified: Secondary | ICD-10-CM | POA: Diagnosis not present

## 2017-08-19 NOTE — Patient Instructions (Signed)
Great to see you!  Come back in 3 months unless you need us sooner.    
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
## 2017-09-08 ENCOUNTER — Encounter: Payer: Self-pay | Admitting: Physician Assistant

## 2017-09-08 ENCOUNTER — Ambulatory Visit: Payer: Medicare PPO | Admitting: Physician Assistant

## 2017-09-08 VITALS — BP 157/78 | HR 66 | Ht 63.0 in | Wt 192.0 lb

## 2017-09-08 DIAGNOSIS — R42 Dizziness and giddiness: Secondary | ICD-10-CM | POA: Diagnosis not present

## 2017-09-08 DIAGNOSIS — R079 Chest pain, unspecified: Secondary | ICD-10-CM | POA: Diagnosis not present

## 2017-09-08 NOTE — Patient Instructions (Signed)
Medication Instructions:  Continue current medications  If you need a refill on your cardiac medications before your next appointment, please call your pharmacy.  Labwork: None ordered   Testing/Procedures: Your physician has requested that you have a lexiscan myoview. For further information please visit [email or web address] Please follow instruction sheet, as given.   Follow-Up: Your physician wants you to follow-up in: Next available.      Thank you for choosing [facility]!!

## 2017-09-08 NOTE — Progress Notes (Signed)
Cardiology Office Note   Date:  09/08/2017   ID:  Holly Chang, DOB Aug 20, 1940, MRN [id number]  PCP:  Elenora Gamma, MD  Cardiologist: Dr. Royann Shivers, 07/27/2016 Theodore Demark, PA-C   Chief Complaint  Patient presents with  . Follow-up    History of Present Illness: Syniyah Bourne is a 77 y.o. female with a history of anxiety, normal Lexiscan Myoview 05/2016, Holter monitor with brief 2-1 AV block but no pauses over 2 seconds, incomplete LBBB with QRS 106 ms, QTC 416 ms  08/19/2017 PCP office visit, patient mentions intermittent episodes of chest pain, appointment made  [name] presents for cardiology followup.  Pt BP high today, says that SBP normally runs 110s-120s at home. She checks it once a week or so.   She has been having episodes of chest pain in the last 2 months. Thinks about once a week. She cannot sleep on her L side. She is sleeping in a [employer] because of mold in the bedroom. If she sleeps on her L side, she will have L chest pain. The chest pain is not exertional. She also got an episode that started while sitting on the sofa, although she was very upset at the time. She got water and sat back down. She was a little SOB with this.The sx resolved without intervention in 10-15 minutes. Sx are "pretty intense", pressure. Not able to rate it. She does deep breathing and drinks water. That helps. Sx are occurring more frequently because of family stress.    She occasionally lifts heavy things, does not get chest pain with them.   She is under tremendous stress due to family issues, related to her sister and her parent's estate. With the extreme stress, she has had chest pain at times.   She thinks she may be getting light-headed or dizzy when she stands up. Has not passed out, has sat back down sometimes.  She has a constant pain on her L side, wakes with it sometimes. It is there most of the day, every day. It is not severe.   She feels her DOE is worse than  usual.    Past Medical History:  Diagnosis Date  . Allergy   . Cataract    bilateral  . Stroke [facility]) 2018   Diagnosed by her cousin, who is a Engineer, civil (consulting). Did not see a doctor. Sx resolved.     Past Surgical History:  Procedure Laterality Date  . ABDOMINAL HYSTERECTOMY    . EYE SURGERY     bilateral cataracts    Current Outpatient Medications  Medication Sig Dispense Refill  . Cholecalciferol (VITAMIN D3) 1000 units CAPS Take 5,000 Units by mouth daily.     . Coenzyme Q10 (CO Q 10) 10 MG CAPS Take by mouth.    . cyanocobalamin 1000 MCG tablet Take 2,500 mcg by mouth daily.      No current facility-administered medications for this visit.     Allergies:   Penicillins    Social History:  The patient  reports that she has never smoked. She has never used smokeless tobacco. She reports that she does not drink alcohol or use drugs.   Family History:  The patient's family history includes Alcohol abuse in her father; Cancer in her paternal uncle; Heart disease in her father; Pulmonary fibrosis in her mother.    ROS:  Please see the history of present illness. All other systems are reviewed and negative.    PHYSICAL EXAM: VS:  BP (!) 157/78   Pulse 66   Ht  (1.6 m)   Wt 192 lb (87.1 kg)   BMI 34.01 kg/m  , BMI Body mass index is 34.01 kg/m. GEN: Well nourished, well developed, female in no acute distress  HEENT: normal for age  Neck: no JVD, no carotid bruit, no masses Cardiac: RRR; no murmur, no rubs, or gallops Respiratory:  clear to auscultation bilaterally, normal work of breathing GI: soft, nontender, nondistended, + BS MS: no deformity or atrophy; trace lower extremity edema; distal pulses are 2+ in all 4 extremities   Skin: warm and dry, no rash Neuro:  Strength and sensation are intact Psych: euthymic mood, full affect  Orthostatic Vital Signs                               Blood Pressure                 HeartRate Lying  123/70 65  Sitting  136/75 67    Standing  139/80 70  Standing 3 minutes  150/80 70    EKG:  EKG is ordered today. The ekg ordered today demonstrates sinus rhythm, heart rate 66, RBBB is consistent with the EKG obtained by Dr. Ermalinda Memos, but different from 06/07/2016 when she had an incomplete LBBB  MYOVIEW: 06/11/2016  The left ventricular ejection fraction is normal (55-65%).  Nuclear stress EF: 59%.  There was no ST segment deviation noted during stress.  The study is normal except for elevated TID at 1.3 which can be seen with multivessel CAD but visually there does not appear to be ischemic dilatation.  This is a low risk study.  Holter monitor: 06/04/2016 Heart rate range 42-108, average 74 Longest pause 1.8 seconds First-degree AV block and 2-1 second-degree AV block at times with no sustained bradycardia  Recent Labs: 07/08/2017: ALT 22; BUN 16; Creatinine, Ser 0.89; Hemoglobin 13.5; Platelets 201; Potassium 4.3; Sodium 143    Lipid Panel    Component Value Date/Time   CHOL 175 07/08/2017 1449   TRIG 120 07/08/2017 1449   HDL 58 07/08/2017 1449   CHOLHDL 3.0 07/08/2017 1449   LDLCALC 93 07/08/2017 1449     Wt Readings from Last 3 Encounters:  09/08/17 192 lb (87.1 kg)  08/19/17 191 lb (86.6 kg)  07/08/17 191 lb 3.2 oz (86.7 kg)     Other studies Reviewed: Additional studies/ records that were reviewed today include: Office notes and testing.  ASSESSMENT AND PLAN:  1.  Chest pain: Her symptoms are atypical, but her last stress test was over a year ago. -Repeat a Lexiscan Myoview, follow-up on the results -If the test is positive for CAD, add aspirin and sublingual nitroglycerin, schedule cath  2.  Dizziness: Orthostatic vital signs were checked in the office and were negative.  She is not actually seeing the room spinning.  She is encouraged to get up carefully.  She is not on any medications that should cause dizziness.  3.  Social issues: She is under a great deal of stress because of  family issues.  She is encouraged to follow-up with Dr. Ermalinda Memos.   Current medicines are reviewed at length with the patient today.  The patient does not have concerns regarding medicines.  The following changes have been made:  no change  Labs/ tests ordered today include:   Orders Placed This Encounter  Procedures  . MYOCARDIAL PERFUSION IMAGING  .  EKG 12-Lead     Disposition:   FU with Dr. Royann Shivers  Signed, Theodore Demark, PA-C  09/08/2017 5:25 PM    [facility] Phone: [phone]; Fax: [phone]  This note was written with the assistance of speech recognition software. Please excuse any transcriptional errors.

## 2017-09-09 NOTE — Progress Notes (Signed)
Thanks, will follow up on Myoview MCr

## 2017-09-13 ENCOUNTER — Telehealth (HOSPITAL_COMMUNITY): Payer: Self-pay

## 2017-09-13 NOTE — Telephone Encounter (Signed)
Encounter complete. 

## 2017-09-14 ENCOUNTER — Telehealth (HOSPITAL_COMMUNITY): Payer: Self-pay

## 2017-09-14 NOTE — Telephone Encounter (Signed)
Encounter complete. 

## 2017-09-15 ENCOUNTER — Ambulatory Visit (HOSPITAL_COMMUNITY)
Admission: RE | Admit: 2017-09-15 | Discharge: 2017-09-15 | Disposition: A | Discharge status: Home / Self Care | Payer: Medicare PPO | Source: Ambulatory Visit | Attending: Cardiovascular Disease | Admitting: Cardiovascular Disease

## 2017-09-15 DIAGNOSIS — R29810 Facial weakness: Secondary | ICD-10-CM | POA: Diagnosis not present

## 2017-09-15 DIAGNOSIS — R0602 Shortness of breath: Secondary | ICD-10-CM | POA: Insufficient documentation

## 2017-09-15 DIAGNOSIS — Z8673 Personal history of transient ischemic attack (TIA), and cerebral infarction without residual deficits: Secondary | ICD-10-CM | POA: Insufficient documentation

## 2017-09-15 DIAGNOSIS — I452 Bifascicular block: Secondary | ICD-10-CM | POA: Insufficient documentation

## 2017-09-15 DIAGNOSIS — Z8249 Family history of ischemic heart disease and other diseases of the circulatory system: Secondary | ICD-10-CM | POA: Diagnosis not present

## 2017-09-15 DIAGNOSIS — F419 Anxiety disorder, unspecified: Secondary | ICD-10-CM | POA: Diagnosis not present

## 2017-09-15 DIAGNOSIS — R079 Chest pain, unspecified: Secondary | ICD-10-CM | POA: Insufficient documentation

## 2017-09-15 DIAGNOSIS — E669 Obesity, unspecified: Secondary | ICD-10-CM | POA: Diagnosis not present

## 2017-09-15 LAB — MYOCARDIAL PERFUSION IMAGING
CHL CUP NUCLEAR SDS: 1
CHL CUP NUCLEAR SRS: 1
CHL CUP NUCLEAR SSS: 2
LV dias vol: 90 mL (ref 46–106)
LV sys vol: 32 mL
Peak HR: 80 {beats}/min
Rest HR: 60 {beats}/min
TID: 1.09

## 2017-09-15 MED ORDER — TECHNETIUM TC 99M TETROFOSMIN IV KIT
31.3000 | PACK | Freq: Once | INTRAVENOUS | Status: AC | PRN
  Administered 2017-09-15: 31.3 via INTRAVENOUS
  Filled 2017-09-15: qty 32

## 2017-09-15 MED ORDER — TECHNETIUM TC 99M TETROFOSMIN IV KIT
10.2000 | PACK | Freq: Once | INTRAVENOUS | Status: AC | PRN
  Administered 2017-09-15: 10.2 via INTRAVENOUS
  Filled 2017-09-15: qty 11

## 2017-09-15 MED ORDER — AMINOPHYLLINE 25 MG/ML IV SOLN
75.0000 mg | Freq: Once | INTRAVENOUS | Status: AC
  Administered 2017-09-15: 75 mg via INTRAVENOUS

## 2017-09-15 MED ORDER — REGADENOSON 0.4 MG/5ML IV SOLN
0.4000 mg | Freq: Once | INTRAVENOUS | Status: AC
  Administered 2017-09-15: 0.4 mg via INTRAVENOUS

## 2017-09-20 ENCOUNTER — Telehealth: Payer: Self-pay | Admitting: Cardiovascular Disease
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
## 2017-09-27 ENCOUNTER — Ambulatory Visit: Payer: Medicare PPO | Admitting: Cardiovascular Disease

## 2017-09-27 ENCOUNTER — Encounter

## 2017-10-17 ENCOUNTER — Ambulatory Visit: Payer: Medicare PPO | Admitting: Physician Assistant

## 2017-10-17 ENCOUNTER — Telehealth: Payer: Self-pay

## 2017-10-17 NOTE — Progress Notes (Deleted)
Cardiology Office Note   Date:  10/17/2017   ID:  Holly BarlowGlenda Chang, DOB 10/14/1940, MRN [id number]  PCP:  Elenora GammaBradshaw, Samuel L, MD  Cardiologist:  Dr Royann Shiversroitoru,  07/27/2016 Theodore Demarkhonda Rosalina Dingwall, PA-C 09/08/2017  No chief complaint on file.   History of Present Illness: Holly Chang is a 77 y.o. female with a history of anxiety, normal Lexiscan Myoview 05/2016, Holter monitor with brief 2-1 AV block but no pauses over 2 seconds, incomplete LBBB with QRS 106 ms, QTC 416 ms  05/02 office visit and palpitations, MV ordered and was normal  Holly Chang presents for ***   Past Medical History:  Diagnosis Date  . Allergy   . Cataract    bilateral  . Stroke [facility](HCC) 2018   Diagnosed by her cousin, who is a Engineer, civil (consulting)nurse. Did not see a doctor. Sx resolved.     Past Surgical History:  Procedure Laterality Date  . ABDOMINAL HYSTERECTOMY    . EYE SURGERY     bilateral cataracts    Current Outpatient Medications  Medication Sig Dispense Refill  . Cholecalciferol (VITAMIN D3) 1000 units CAPS Take 5,000 Units by mouth daily.     . Coenzyme Q10 (CO Q 10) 10 MG CAPS Take by mouth.    . cyanocobalamin 1000 MCG tablet Take 2,500 mcg by mouth daily.      No current facility-administered medications for this visit.     Allergies:   Penicillins    Social History:  The patient  reports that she has never smoked. She has never used smokeless tobacco. She reports that she does not drink alcohol or use drugs.   Family History:  The patient's family history includes Alcohol abuse in her father; Cancer in her paternal uncle; Heart disease in her father; Pulmonary fibrosis in her mother.    ROS:  Please see the history of present illness. All other systems are reviewed and negative.    PHYSICAL EXAM: VS:  There were no vitals taken for this visit. , BMI There is no height or weight on file to calculate BMI. GEN: Well nourished, well developed, female in no acute distress  HEENT: normal for age  Neck: no  JVD, no carotid bruit, no masses Cardiac: RRR; no murmur, no rubs, or gallops Respiratory:  clear to auscultation bilaterally, normal work of breathing GI: soft, nontender, nondistended, + BS MS: no deformity or atrophy; no edema; distal pulses are 2+ in all 4 extremities   Skin: warm and dry, no rash Neuro:  Strength and sensation are intact Psych: euthymic mood, full affect   EKG:  EKG {ACTION; IS/IS [id number]} ordered today. The ekg ordered today demonstrates ***  MYOVIEW: 09/15/2017  The left ventricular ejection fraction is normal (55-65%).  Nuclear stress EF: 64%.  There was no ST segment deviation noted during stress.  The study is normal.  This is a low risk study.   Normal stress nuclear study with no ischemia or infarction; EF 64 with normal wall motion.   Recent Labs: 07/08/2017: ALT 22; BUN 16; Creatinine, Ser 0.89; Hemoglobin 13.5; Platelets 201; Potassium 4.3; Sodium 143    Lipid Panel    Component Value Date/Time   CHOL 175 07/08/2017 1449   TRIG 120 07/08/2017 1449   HDL 58 07/08/2017 1449   CHOLHDL 3.0 07/08/2017 1449   LDLCALC 93 07/08/2017 1449     Wt Readings from Last 3 Encounters:  09/15/17 192 lb (87.1 kg)  09/08/17 192 lb (87.1 kg)  08/19/17 191 lb (  86.6 kg)     Other studies Reviewed: Additional studies/ records that were reviewed today include: ***.  ASSESSMENT AND PLAN:  1.  ***   Current medicines are reviewed at length with the patient today.  The patient {ACTIONS; HAS/DOES NOT HAVE:19233} concerns regarding medicines.  The following changes have been made:  {PLAN; NO CHANGE:13088:s}  Labs/ tests ordered today include: *** No orders of the defined types were placed in this encounter.    Disposition:   FU with Dr Royann Shivers  Signed, Theodore Demark, PA-C  10/17/2017 9:01 AM    [facility] Phone: [phone]; Fax: [phone]  This note was written with the assistance of speech recognition  software. Please excuse any transcriptional errors.

## 2017-10-17 NOTE — Telephone Encounter (Signed)
Patient had an appointment with Holly Chang today at 10:30am and did not show up.

## 2017-10-17 NOTE — Telephone Encounter (Signed)
Per Bjorn Loserhonda I gave the patient a call but she did not answer, message left.  Per Bjorn Loserhonda I contacted patient to review the stress tests results and she inform her that she needs to follow up with Dr Royann Shiversroitoru in 12 months. Waiting for patient to return call to office.

## 2017-10-18 ENCOUNTER — Encounter: Payer: Self-pay | Admitting: *Deleted

## 2017-10-20 NOTE — Telephone Encounter (Signed)
Unable to reach patient; [facility] and she stated the patient does not live with her but she will pass along the message for patient to contact office.  Awaiting a call back.

## 2017-10-20 NOTE — Telephone Encounter (Signed)
Received  Call from patient informed her of stress test results and to fo.llow up with Dr Royann Shiversroitoru nest year. Patient voiced understanding.

## 2017-10-21 ENCOUNTER — Encounter: Payer: Self-pay | Admitting: Family Medicine

## 2017-10-21 ENCOUNTER — Ambulatory Visit (INDEPENDENT_AMBULATORY_CARE_PROVIDER_SITE_OTHER): Payer: Medicare PPO | Admitting: Family Medicine

## 2017-10-21 VITALS — BP 132/67 | HR 76 | Temp 96.9°F | Ht 63.0 in | Wt 200.0 lb

## 2017-10-21 DIAGNOSIS — L304 Erythema intertrigo: Secondary | ICD-10-CM

## 2017-10-21 DIAGNOSIS — W57XXXA Bitten or stung by nonvenomous insect and other nonvenomous arthropods, initial encounter: Secondary | ICD-10-CM | POA: Diagnosis not present

## 2017-10-21 MED ORDER — FLUCONAZOLE 150 MG PO TABS: ORAL_TABLET | ORAL | 0 refills | Status: DC

## 2017-10-21 MED ORDER — KETOCONAZOLE 2 % EX CREA: 1.0000 "application " | TOPICAL_CREAM | Freq: Every day | CUTANEOUS | 1 refills | Status: DC
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
## 2017-10-21 NOTE — Patient Instructions (Signed)
Great to see you!  Try ketoconazole cream 1-2 times daily, also take diflucan today and 1 more time in 1 week  Try hydrocortisone cream for your feet for itching. .Marland Kitchen

## 2018-01-06 ENCOUNTER — Ambulatory Visit (INDEPENDENT_AMBULATORY_CARE_PROVIDER_SITE_OTHER): Payer: Medicare PPO | Admitting: Family Medicine

## 2018-01-06 ENCOUNTER — Encounter: Payer: Self-pay | Admitting: Family Medicine

## 2018-01-06 VITALS — BP 125/69 | HR 60 | Temp 97.4°F | Ht 63.0 in | Wt 187.8 lb

## 2018-01-06 DIAGNOSIS — F22 Delusional disorders: Secondary | ICD-10-CM | POA: Diagnosis not present

## 2018-01-06 DIAGNOSIS — F419 Anxiety disorder, unspecified: Secondary | ICD-10-CM | POA: Diagnosis not present

## 2018-01-06 DIAGNOSIS — R002 Palpitations: Secondary | ICD-10-CM
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
## 2018-01-06 NOTE — Patient Instructions (Signed)
You had labs performed today.  You will be contacted with the results of the labs once they are available, usually in the next 3 business days for routine lab work.   If you have recurrent heart symptoms, I would like you to contact your cardiologist for evaluation.  We discussed today that your work-up of been to this point has been fairly unremarkable.  I have placed a referral to the specialist for your anxiety today as well.   Palpitations A palpitation is the feeling that your heart:  Has an uneven (irregular) heartbeat.  Is beating faster than normal.  Is fluttering.  Is skipping a beat.  This is usually not a serious problem. In some cases, you may need more medical tests. Follow these instructions at home:  Avoid: ? Caffeine in coffee, tea, soft drinks, diet pills, and energy drinks. ? Chocolate. ? Alcohol.  Do not use any tobacco products. These include cigarettes, chewing tobacco, and e-cigarettes. If you need help quitting, ask your doctor.  Try to reduce your stress. These things may help: ? Yoga. ? Meditation. ? Physical activity. Swimming, jogging, and walking are good choices. ? A method that helps you use your mind to control things in your body, like heartbeats (biofeedback).  Get plenty of rest and sleep.  Take over-the-counter and prescription medicines only as told by your doctor.  Keep all follow-up visits as told by your doctor. This is important. Contact a doctor if:  Your heartbeat is still fast or uneven after 24 hours.  Your palpitations occur more often. Get help right away if:  You have chest pain.  You feel short of breath.  You have a very bad headache.  You feel dizzy.  You pass out (faint). This information is not intended to replace advice given to you by your health care provider. Make sure you discuss any questions you have with your health care provider. Document Released: 02/03/2008 Document Revised: 10/02/2015 Document  Reviewed: 01/09/2015 Elsevier Interactive Patient Education  [employer]2018 [employer]

## 2018-01-07 LAB — BASIC METABOLIC PANEL
BUN / CREAT RATIO: 19 (ref 12–28)
BUN: 15 mg/dL (ref 8–27)
CALCIUM: 9.4 mg/dL (ref 8.7–10.3)
CHLORIDE: 104 mmol/L (ref 96–106)
CO2: 23 mmol/L (ref 20–29)
Creatinine, Ser: 0.79 mg/dL (ref 0.57–1.00)
GFR calc Af Amer: 84 mL/min/{1.73_m2} (ref >59)
GFR calc non Af Amer: 73 mL/min/{1.73_m2} (ref >59)
Glucose: 97 mg/dL (ref 65–99)
Potassium: 4.6 mmol/L (ref 3.5–5.2)
SODIUM: 142 mmol/L (ref 134–144)

## 2018-01-07 LAB — CBC WITH DIFFERENTIAL/PLATELET
BASOS ABS: 0 10*3/uL (ref 0.0–0.2)
Basos: 1 %
EOS (ABSOLUTE): 0.2 10*3/uL (ref 0.0–0.4)
Eos: 2 %
Hematocrit: 38.8 % (ref 34.0–46.6)
Hemoglobin: 13 g/dL (ref 11.1–15.9)
IMMATURE GRANULOCYTES: 0 %
Immature Grans (Abs): 0 10*3/uL (ref 0.0–0.1)
LYMPHS ABS: 1.8 10*3/uL (ref 0.7–3.1)
Lymphs: 21 %
MCH: 31 pg (ref 26.6–33.0)
MCHC: 33.5 g/dL (ref 31.5–35.7)
MCV: 93 fL (ref 79–97)
MONOCYTES: 7 %
MONOS ABS: 0.6 10*3/uL (ref 0.1–0.9)
Neutrophils Absolute: 6.2 10*3/uL (ref 1.4–7.0)
Neutrophils: 69 %
PLATELETS: 193 10*3/uL (ref 150–450)
RBC: 4.19 x10E6/uL (ref 3.77–5.28)
RDW: 12.5 % (ref 12.3–15.4)
WBC: 8.8 10*3/uL (ref 3.4–10.8)

## 2018-01-07 LAB — TSH: TSH: 1.7 u[IU]/mL (ref 0.450–4.500)

## 2018-01-13 ENCOUNTER — Ambulatory Visit (HOSPITAL_COMMUNITY)
Admission: EM | Disposition: A | Discharge status: Home / Self Care | Payer: Self-pay | Source: Home / Self Care | Attending: Emergency Medicine

## 2018-01-13 ENCOUNTER — Ambulatory Visit (INDEPENDENT_AMBULATORY_CARE_PROVIDER_SITE_OTHER): Payer: Medicare PPO | Admitting: Physician Assistant

## 2018-01-13 ENCOUNTER — Encounter: Payer: Self-pay | Admitting: Physician Assistant

## 2018-01-13 ENCOUNTER — Observation Stay (HOSPITAL_COMMUNITY)
Admission: EM | Admit: 2018-01-13 | Discharge: 2018-01-14 | Disposition: A | Discharge status: Home / Self Care | Payer: Medicare PPO | Attending: Internal Medicine | Admitting: Internal Medicine

## 2018-01-13 ENCOUNTER — Ambulatory Visit (HOSPITAL_COMMUNITY): Payer: Medicare PPO

## 2018-01-13 ENCOUNTER — Emergency Department (HOSPITAL_COMMUNITY): Payer: Medicare PPO

## 2018-01-13 ENCOUNTER — Other Ambulatory Visit: Payer: Self-pay

## 2018-01-13 ENCOUNTER — Encounter (HOSPITAL_COMMUNITY): Payer: Self-pay | Admitting: *Deleted

## 2018-01-13 VITALS — BP 160/70 | HR 34 | Temp 97.0°F | Ht 63.0 in | Wt 187.0 lb

## 2018-01-13 DIAGNOSIS — R0789 Other chest pain: Secondary | ICD-10-CM

## 2018-01-13 DIAGNOSIS — R531 Weakness: Secondary | ICD-10-CM | POA: Diagnosis not present

## 2018-01-13 DIAGNOSIS — Z8673 Personal history of transient ischemic attack (TIA), and cerebral infarction without residual deficits: Secondary | ICD-10-CM | POA: Diagnosis not present

## 2018-01-13 DIAGNOSIS — Z8249 Family history of ischemic heart disease and other diseases of the circulatory system: Secondary | ICD-10-CM | POA: Diagnosis not present

## 2018-01-13 DIAGNOSIS — Z90710 Acquired absence of both cervix and uterus: Secondary | ICD-10-CM | POA: Diagnosis not present

## 2018-01-13 DIAGNOSIS — R0600 Dyspnea, unspecified: Secondary | ICD-10-CM

## 2018-01-13 DIAGNOSIS — R001 Bradycardia, unspecified: Secondary | ICD-10-CM | POA: Diagnosis not present

## 2018-01-13 DIAGNOSIS — Z9889 Other specified postprocedural states: Secondary | ICD-10-CM | POA: Insufficient documentation

## 2018-01-13 DIAGNOSIS — I441 Atrioventricular block, second degree: Principal | ICD-10-CM

## 2018-01-13 DIAGNOSIS — H269 Unspecified cataract: Secondary | ICD-10-CM | POA: Insufficient documentation

## 2018-01-13 DIAGNOSIS — Z880 Allergy status to penicillin: Secondary | ICD-10-CM | POA: Insufficient documentation

## 2018-01-13 DIAGNOSIS — F419 Anxiety disorder, unspecified: Secondary | ICD-10-CM | POA: Diagnosis not present

## 2018-01-13 DIAGNOSIS — R079 Chest pain, unspecified: Secondary | ICD-10-CM

## 2018-01-13 DIAGNOSIS — Z950 Presence of cardiac pacemaker: Secondary | ICD-10-CM

## 2018-01-13 HISTORY — DX: Personal history of other medical treatment: Z92.89

## 2018-01-13 HISTORY — DX: Presence of cardiac pacemaker: Z95.0

## 2018-01-13 HISTORY — PX: PACEMAKER IMPLANT: EP1218

## 2018-01-13 HISTORY — PX: INSERT / REPLACE / REMOVE PACEMAKER: SUR710

## 2018-01-13 HISTORY — DX: Atrioventricular block, second degree: I44.1

## 2018-01-13 HISTORY — DX: Bradycardia, unspecified: R00.1

## 2018-01-13 LAB — CBC WITH DIFFERENTIAL/PLATELET
Abs Immature Granulocytes: 0 10*3/uL (ref 0.0–0.1)
BASOS PCT: 1 %
Basophils Absolute: 0.1 10*3/uL (ref 0.0–0.1)
EOS ABS: 0.2 10*3/uL (ref 0.0–0.7)
Eosinophils Relative: 3 %
HCT: 39.7 % (ref 36.0–46.0)
Hemoglobin: 12.5 g/dL (ref 12.0–15.0)
Immature Granulocytes: 0 %
Lymphocytes Relative: 24 %
Lymphs Abs: 2.1 10*3/uL (ref 0.7–4.0)
MCH: 30.9 pg (ref 26.0–34.0)
MCHC: 31.5 g/dL (ref 30.0–36.0)
MCV: 98.3 fL (ref 78.0–100.0)
MONOS PCT: 8 %
Monocytes Absolute: 0.7 10*3/uL (ref 0.1–1.0)
Neutro Abs: 5.6 10*3/uL (ref 1.7–7.7)
Neutrophils Relative %: 64 %
PLATELETS: 160 10*3/uL (ref 150–400)
RBC: 4.04 MIL/uL (ref 3.87–5.11)
RDW: 13.2 % (ref 11.5–15.5)
WBC: 8.6 10*3/uL (ref 4.0–10.5)

## 2018-01-13 LAB — COMPREHENSIVE METABOLIC PANEL
ALT: 24 U/L (ref 0–44)
ANION GAP: 6 (ref 5–15)
AST: 21 U/L (ref 15–41)
Albumin: 3.5 g/dL (ref 3.5–5.0)
Alkaline Phosphatase: 98 U/L (ref 38–126)
BUN: 11 mg/dL (ref 8–23)
CHLORIDE: 110 mmol/L (ref 98–111)
CO2: 26 mmol/L (ref 22–32)
Calcium: 9.2 mg/dL (ref 8.9–10.3)
Creatinine, Ser: 0.97 mg/dL (ref 0.44–1.00)
GFR, EST NON AFRICAN AMERICAN: 55 mL/min — AB (ref >60)
Glucose, Bld: 84 mg/dL (ref 70–99)
POTASSIUM: 3.9 mmol/L (ref 3.5–5.1)
Sodium: 142 mmol/L (ref 135–145)
Total Bilirubin: 1.3 mg/dL — ABNORMAL HIGH (ref 0.3–1.2)
Total Protein: 6.2 g/dL — ABNORMAL LOW (ref 6.5–8.1)

## 2018-01-13 LAB — I-STAT CHEM 8, ED
BUN: 13 mg/dL (ref 8–23)
CALCIUM ION: 1.22 mmol/L (ref 1.15–1.40)
CHLORIDE: 108 mmol/L (ref 98–111)
CREATININE: 0.9 mg/dL (ref 0.44–1.00)
GLUCOSE: 80 mg/dL (ref 70–99)
HCT: 37 % (ref 36.0–46.0)
HEMOGLOBIN: 12.6 g/dL (ref 12.0–15.0)
Potassium: 3.9 mmol/L (ref 3.5–5.1)
SODIUM: 142 mmol/L (ref 135–145)
TCO2: 24 mmol/L (ref 22–32)

## 2018-01-13 LAB — LIPASE, BLOOD: LIPASE: 33 U/L (ref 11–51)

## 2018-01-13 LAB — I-STAT TROPONIN, ED: Troponin i, poc: 0.02 ng/mL (ref 0.00–0.08)

## 2018-01-13 SURGERY — PACEMAKER IMPLANT
Anesthesia: LOCAL

## 2018-01-13 MED ORDER — KETOROLAC TROMETHAMINE 30 MG/ML IJ SOLN
30.0000 mg | Freq: Once | INTRAMUSCULAR | Status: AC
  Administered 2018-01-13: 30 mg via INTRAVENOUS
  Filled 2018-01-13: qty 1

## 2018-01-13 MED ORDER — ONDANSETRON HCL 4 MG/2ML IJ SOLN: 4.0000 mg | Freq: Four times a day (QID) | INTRAMUSCULAR | Status: DC | PRN

## 2018-01-13 MED ORDER — KETOROLAC TROMETHAMINE 30 MG/ML IJ SOLN: 30.0000 mg | Freq: Once | INTRAMUSCULAR | Status: DC

## 2018-01-13 MED ORDER — SODIUM CHLORIDE 0.9 % IV SOLN
INTRAVENOUS | Status: DC | PRN
  Administered 2018-01-13: 500 mL

## 2018-01-13 MED ORDER — FENTANYL CITRATE (PF) 100 MCG/2ML IJ SOLN
INTRAMUSCULAR | Status: DC | PRN
  Administered 2018-01-13: 12.5 ug via INTRAVENOUS
  Administered 2018-01-13: 25 ug via INTRAVENOUS
  Administered 2018-01-13: 12.5 ug via INTRAVENOUS

## 2018-01-13 MED ORDER — FENTANYL CITRATE (PF) 100 MCG/2ML IJ SOLN
INTRAMUSCULAR | Status: AC
  Filled 2018-01-13: qty 2

## 2018-01-13 MED ORDER — SODIUM CHLORIDE 0.9% FLUSH: 3.0000 mL | INTRAVENOUS | Status: DC | PRN

## 2018-01-13 MED ORDER — MIDAZOLAM HCL 5 MG/5ML IJ SOLN
INTRAMUSCULAR | Status: AC
  Filled 2018-01-13: qty 5

## 2018-01-13 MED ORDER — NITROGLYCERIN 0.4 MG SL SUBL: 0.4000 mg | SUBLINGUAL_TABLET | SUBLINGUAL | Status: DC | PRN

## 2018-01-13 MED ORDER — ACETAMINOPHEN 325 MG PO TABS
325.0000 mg | ORAL_TABLET | ORAL | Status: DC | PRN
  Administered 2018-01-14: 650 mg via ORAL
  Filled 2018-01-13: qty 2

## 2018-01-13 MED ORDER — ALPRAZOLAM 0.25 MG PO TABS
0.2500 mg | ORAL_TABLET | Freq: Three times a day (TID) | ORAL | Status: DC | PRN
  Administered 2018-01-13: 0.25 mg via ORAL
  Filled 2018-01-13: qty 1

## 2018-01-13 MED ORDER — SODIUM CHLORIDE 0.9% FLUSH: 3.0000 mL | Freq: Two times a day (BID) | INTRAVENOUS | Status: DC

## 2018-01-13 MED ORDER — VANCOMYCIN HCL IN DEXTROSE 1-5 GM/200ML-% IV SOLN
INTRAVENOUS | Status: AC
  Filled 2018-01-13: qty 200

## 2018-01-13 MED ORDER — HEPARIN (PORCINE) IN NACL 1000-0.9 UT/500ML-% IV SOLN
INTRAVENOUS | Status: DC | PRN
  Administered 2018-01-13: 500 mL

## 2018-01-13 MED ORDER — VANCOMYCIN HCL 1000 MG IV SOLR
INTRAVENOUS | Status: AC | PRN
  Administered 2018-01-13: 1000 mg via INTRAVENOUS

## 2018-01-13 MED ORDER — LIDOCAINE HCL (PF) 1 % IJ SOLN
INTRAMUSCULAR | Status: AC
  Filled 2018-01-13: qty 60

## 2018-01-13 MED ORDER — SODIUM CHLORIDE 0.9 % IV SOLN
INTRAVENOUS | Status: AC
  Filled 2018-01-13: qty 2

## 2018-01-13 MED ORDER — SODIUM CHLORIDE 0.9 % IV SOLN: 250.0000 mL | INTRAVENOUS | Status: DC

## 2018-01-13 MED ORDER — ZOLPIDEM TARTRATE 5 MG PO TABS: 5.0000 mg | ORAL_TABLET | Freq: Every evening | ORAL | Status: DC | PRN

## 2018-01-13 MED ORDER — HEPARIN (PORCINE) IN NACL 1000-0.9 UT/500ML-% IV SOLN
INTRAVENOUS | Status: AC
  Filled 2018-01-13: qty 500

## 2018-01-13 MED ORDER — SODIUM CHLORIDE 0.9 % IV SOLN: 80.0000 mg | INTRAVENOUS | Status: DC

## 2018-01-13 MED ORDER — LIDOCAINE HCL (PF) 1 % IJ SOLN
INTRAMUSCULAR | Status: DC | PRN
  Administered 2018-01-13: 45 mL

## 2018-01-13 MED ORDER — ACETAMINOPHEN 325 MG PO TABS: 650.0000 mg | ORAL_TABLET | ORAL | Status: DC | PRN

## 2018-01-13 MED ORDER — VANCOMYCIN HCL IN DEXTROSE 1-5 GM/200ML-% IV SOLN: 1000.0000 mg | INTRAVENOUS | Status: AC

## 2018-01-13 MED ORDER — MIDAZOLAM HCL 5 MG/5ML IJ SOLN
INTRAMUSCULAR | Status: DC | PRN
  Administered 2018-01-13 (×2): 1 mg via INTRAVENOUS

## 2018-01-13 MED ORDER — ENOXAPARIN SODIUM 40 MG/0.4ML ~~LOC~~ SOLN: 40.0000 mg | SUBCUTANEOUS | Status: DC

## 2018-01-13 MED ORDER — KETOROLAC TROMETHAMINE 30 MG/ML IJ SOLN
30.0000 mg | Freq: Once | INTRAMUSCULAR | Status: AC | PRN
  Administered 2018-01-14: 09:00:00 30 mg via INTRAVENOUS
  Filled 2018-01-13 (×2): qty 1

## 2018-01-13 MED ORDER — VANCOMYCIN HCL IN DEXTROSE 1-5 GM/200ML-% IV SOLN
1000.0000 mg | Freq: Two times a day (BID) | INTRAVENOUS | Status: AC
  Administered 2018-01-14: 1000 mg via INTRAVENOUS
  Filled 2018-01-13: qty 200

## 2018-01-13 SURGICAL SUPPLY — 7 items
CABLE SURGICAL S-101-97-12 (CABLE) ×2 IMPLANT
LEAD TENDRIL MRI 46CM LPA1200M (Lead) ×2 IMPLANT
LEAD TENDRIL MRI 52CM LPA1200M (Lead) ×2 IMPLANT
PACEMAKER ASSURITY DR-RF (Pacemaker) ×2 IMPLANT
PAD PRO RADIOLUCENT 2001M-C (PAD) ×2 IMPLANT
SHEATH CLASSIC 8F (SHEATH) ×4 IMPLANT
TRAY PACEMAKER INSERTION (PACKS) ×2 IMPLANT

## 2018-01-13 NOTE — ED Notes (Signed)
Her niece is on the way here

## 2018-01-13 NOTE — ED Notes (Signed)
To cath lab now for a [facility]
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
## 2018-01-13 NOTE — Progress Notes (Signed)
Pt put on monitor on arrival to cath lab.  Consent signed and IV started.  Dr Ladona Ridgel in to see pt.  Pt to cath lab via stretcher.

## 2018-01-13 NOTE — Progress Notes (Signed)
PCXR done. Dr. Ladona Ridgel here to look at result. OK to go to 6c

## 2018-01-13 NOTE — Interval H&P Note (Signed)
History and Physical Interval Note:  01/13/2018 6:10 PM  Holly Chang  has presented today for surgery, with the diagnosis of bradycardia  The various methods of treatment have been discussed with the patient and family. After consideration of risks, benefits and other options for treatment, the patient has consented to  Procedure(s): PACEMAKER IMPLANT (N/A) as a surgical intervention .  The patient's history has been reviewed, patient examined, no change in status, stable for surgery.  I have reviewed the patient's chart and labs.  Questions were answered to the patient's satisfaction.     Lewayne Bunting

## 2018-01-13 NOTE — Progress Notes (Signed)
Patient c/o pain when breathing post PPM implant.  New onset, Dr Ladona Ridgel made aware. Orders for PCXR in cath holding prior to transfer to 6C03.  Pain meds given see MAR. Cato Mulligan, RN

## 2018-01-13 NOTE — Progress Notes (Signed)
BP (!) 160/70 (BP Location: Left Arm, Patient Position: Sitting, Cuff Size: Normal)   Pulse (!) 34   Temp (!) 97 F (36.1 C) (Oral)   Ht 5\' 3"  (1.6 m)   Wt 187 lb (84.8 kg)   BMI 33.13 kg/m    Subjective:    Patient ID: Holly Chang, female    DOB: 1940-07-06, 77 y.o.   MRN: [id number]  HPI: Holly Chang is a 77 y.o. female presenting on 01/13/2018 for Chest Pain  This patient comes in emergency basis today to our clinic.  She did drive herself.  Over the past couple days she has been having some mild chest pain that does go into her left scapular area.  It does not radiate rotate the neck or the left arm.  She has not had any diaphoresis.  She is just been very tired and lethargic.  On examination her EKG shows bradycardia that is staying in the 30s.  In May her previous EKG showed consistently in the 60s and in reviewing her flow sheets she has not had any bradycardia in the past.  She does not have any new cardiac events that have been going on.  She is quite uncomfortable.  Past Medical History:  Diagnosis Date  . Allergy   . Cataract    bilateral  . Stroke [facility]) 2018   Diagnosed by her cousin, who is a Engineer, civil (consulting). Did not see a doctor. Sx resolved.    Relevant past medical, surgical, family and social history reviewed and updated as indicated. Interim medical history since our last visit reviewed. Allergies and medications reviewed and updated. DATA REVIEWED: CHART IN EPIC  Family History reviewed for pertinent findings.  Review of Systems  Constitutional: Positive for activity change and fatigue. Negative for chills, diaphoresis and fever.  HENT: Negative.   Eyes: Negative.   Respiratory: Positive for shortness of breath. Negative for cough and wheezing.   Cardiovascular: Positive for chest pain. Negative for palpitations.  Gastrointestinal: Negative.  Negative for abdominal pain.  Endocrine: Negative.   Genitourinary: Negative.  Negative for dysuria.  Musculoskeletal:  Negative.   Skin: Negative.   Neurological: Positive for weakness.    Allergies as of 01/13/2018      Reactions   Penicillins Hives      Medication List        Accurate as of 01/13/18  3:56 PM. Always use your most recent med list.          Co Q 10 10 MG Caps Take by mouth.   cyanocobalamin 1000 MCG tablet Take 5,000 mcg by mouth daily.   ketoconazole 2 % cream Commonly known as:  NIZORAL Apply 1 application topically daily.   Vitamin D3 1000 units Caps Take 5,000 Units by mouth daily.          Objective:    BP (!) 160/70 (BP Location: Left Arm, Patient Position: Sitting, Cuff Size: Normal)   Pulse (!) 34   Temp (!) 97 F (36.1 C) (Oral)   Ht 5\' 3"  (1.6 m)   Wt 187 lb (84.8 kg)   BMI 33.13 kg/m   Allergies  Allergen Reactions  . Penicillins Hives    Wt Readings from Last 3 Encounters:  01/13/18 160 lb (72.6 kg)  01/13/18 187 lb (84.8 kg)  01/06/18 187 lb 12.8 oz (85.2 kg)    Physical Exam  Constitutional: She is oriented to person, place, and time. She appears well-developed and well-nourished.  HENT:  Head:  Normocephalic and atraumatic.  Eyes: Pupils are equal, round, and reactive to light. Conjunctivae and EOM are normal.  Cardiovascular: Regular rhythm, S1 normal, S2 normal, normal heart sounds and intact distal pulses. Bradycardia present.  No murmur heard. Pulmonary/Chest: Effort normal and breath sounds normal.  Abdominal: Soft. Bowel sounds are normal.  Neurological: She is alert and oriented to person, place, and time. She has normal reflexes.  Skin: Skin is warm and dry. No rash noted.  Psychiatric: She has a normal mood and affect. Her behavior is normal. Judgment and thought content normal.    Results for orders placed or performed in visit on 01/06/18  TSH  Result Value Ref Range   TSH 1.700 0.450 - 4.500 uIU/mL  Basic Metabolic Panel  Result Value Ref Range   Glucose 97 65 - 99 mg/dL   BUN 15 8 - 27 mg/dL   Creatinine, Ser 8.88  0.57 - 1.00 mg/dL   GFR calc non Af Amer 73 >59 mL/min/1.73   GFR calc Af Amer 84 >59 mL/min/1.73   BUN/Creatinine Ratio 19 12 - 28   Sodium 142 134 - 144 mmol/L   Potassium 4.6 3.5 - 5.2 mmol/L   Chloride 104 96 - 106 mmol/L   CO2 23 20 - 29 mmol/L   Calcium 9.4 8.7 - 10.3 mg/dL  CBC with Differential  Result Value Ref Range   WBC 8.8 3.4 - 10.8 x10E3/uL   RBC 4.19 3.77 - 5.28 x10E6/uL   Hemoglobin 13.0 11.1 - 15.9 g/dL   Hematocrit 28.0 03.4 - 46.6 %   MCV 93 79 - 97 fL   MCH 31.0 26.6 - 33.0 pg   MCHC 33.5 31.5 - 35.7 g/dL   RDW 91.7 91.5 - 05.6 %   Platelets 193 150 - 450 x10E3/uL   Neutrophils 69 Not Estab. %   Lymphs 21 Not Estab. %   Monocytes 7 Not Estab. %   Eos 2 Not Estab. %   Basos 1 Not Estab. %   Neutrophils Absolute 6.2 1.4 - 7.0 x10E3/uL   Lymphocytes Absolute 1.8 0.7 - 3.1 x10E3/uL   Monocytes Absolute 0.6 0.1 - 0.9 x10E3/uL   EOS (ABSOLUTE) 0.2 0.0 - 0.4 x10E3/uL   Basophils Absolute 0.0 0.0 - 0.2 x10E3/uL   Immature Granulocytes 0 Not Estab. %   Immature Grans (Abs) 0.0 0.0 - 0.1 x10E3/uL      Assessment & Plan:   1. Chest pain, unspecified type - EKG 12-Lead  2. Bradycardia - EKG 12-Lead   Continue all other maintenance medications as listed above.  Follow up plan: No follow-ups on file.  Educational handout given for survey  Remus Loffler PA-C [facility] [address]  [place], [place] [address] [phone]   01/13/2018, 3:56 PM
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
## 2018-01-13 NOTE — ED Notes (Signed)
The pt is not sob at present

## 2018-01-14 ENCOUNTER — Observation Stay (HOSPITAL_COMMUNITY): Payer: Medicare PPO

## 2018-01-14 ENCOUNTER — Encounter (HOSPITAL_COMMUNITY): Payer: Self-pay | Admitting: Physician Assistant

## 2018-01-14 DIAGNOSIS — I441 Atrioventricular block, second degree: Secondary | ICD-10-CM | POA: Diagnosis not present

## 2018-01-14 DIAGNOSIS — R001 Bradycardia, unspecified: Secondary | ICD-10-CM | POA: Diagnosis not present

## 2018-01-14 DIAGNOSIS — R0789 Other chest pain: Secondary | ICD-10-CM

## 2018-01-14 DIAGNOSIS — Z950 Presence of cardiac pacemaker: Secondary | ICD-10-CM

## 2018-01-14 DIAGNOSIS — F419 Anxiety disorder, unspecified: Secondary | ICD-10-CM | POA: Diagnosis not present

## 2018-01-14 DIAGNOSIS — R531 Weakness: Secondary | ICD-10-CM | POA: Diagnosis not present

## 2018-01-14 LAB — COMPREHENSIVE METABOLIC PANEL
ALK PHOS: 84 U/L (ref 38–126)
ALT: 20 U/L (ref 0–44)
AST: 18 U/L (ref 15–41)
Albumin: 3 g/dL — ABNORMAL LOW (ref 3.5–5.0)
Anion gap: 5 (ref 5–15)
BUN: 12 mg/dL (ref 8–23)
CALCIUM: 8.5 mg/dL — AB (ref 8.9–10.3)
CO2: 24 mmol/L (ref 22–32)
CREATININE: 0.83 mg/dL (ref 0.44–1.00)
Chloride: 112 mmol/L — ABNORMAL HIGH (ref 98–111)
GFR calc non Af Amer: >60 mL/min (ref >60)
GLUCOSE: 113 mg/dL — AB (ref 70–99)
Potassium: 3.9 mmol/L (ref 3.5–5.1)
SODIUM: 141 mmol/L (ref 135–145)
Total Bilirubin: 1.4 mg/dL — ABNORMAL HIGH (ref 0.3–1.2)
Total Protein: 5.5 g/dL — ABNORMAL LOW (ref 6.5–8.1)

## 2018-01-14 MED ORDER — IBUPROFEN 200 MG PO TABS: ORAL_TABLET | ORAL | Status: DC

## 2018-01-14 MED ORDER — YOU HAVE A PACEMAKER BOOK
Freq: Once | Status: DC
  Filled 2018-01-14: qty 1

## 2018-01-14 NOTE — Discharge Summary (Addendum)
Discharge Summary    Patient ID: Holly Chang,  MRN: [id number], DOB/AGE: [birth year] 77 y.o.  Admit date: 01/13/2018 Discharge date: 01/14/2018  Primary Care Provider: Raliegh Ip Primary Cardiologist: Thurmon Fair, MD Primary Electrophysiologist:  Seen by Dr. Ladona Ridgel this admission  Discharge Diagnoses    Principal Problem:   Symptomatic bradycardia Active Problems:   S/P placement of cardiac pacemaker   Anxiety   Mobitz type 2 second degree AV block   Atypical chest pain   Diagnostic Studies/Procedures    PPM 01/13/18 Procedural Details/Technique  Technical Details SURGEON: Lewayne Bunting, MD   PREPROCEDURE DIAGNOSIS: Symptomatic Bradycardia due to 2:1 AV block  POSTPROCEDURE DIAGNOSIS: Same as preprocedure diagnosis  PROCEDURES:  1. Pacemaker implantation.   INTRODUCTION: Holly Chang is a 77 y.o. female with a history of bradycardia due to AV block who presents today for pacemaker implantation. The patient reports intermittent episodes of dizziness over the past few months. No reversible causes have been identified. The patient therefore presents today for pacemaker implantation.   DESCRIPTION OF PROCEDURE: Informed written consent was obtained, and  the patient was brought to the electrophysiology lab in a fasting state. The patient was sedated with IV versed and fentanyl for the procedure today. The patients left chest was prepped and draped in the usual sterile fashion by the EP lab staff. The skin overlying the left deltopectoral region was infiltrated with lidocaine for local analgesia. A 4-cm incision was made over the left deltopectoral region. A left subcutaneous pacemaker pocket was fashioned using a combination of sharp and blunt dissection. Electrocautery was required to assure hemostasis.    RA/RV Lead Placement: The left axillary vein was punctured. Through the left axillary vein, a St. Jude (serial number [id number]) right atrial lead and a St. Jude  (serial number [id number]) right ventricular lead were advanced with fluoroscopic visualization into the right atrial appendage and right ventricular apical septal positions respectively. Initial atrial lead P- waves measured 2 mV with impedance of 517 ohms and a threshold of 1.3 V at 0.5 msec. Right ventricular lead R-waves measured 11 mV with an impedance of 788 ohms and a threshold of 0.6 V at 0.5 msec. Both leads were secured to the pectoralis fascia using #2-0 silk over the suture sleeves.   Device Placement: The leads were then connected to a St. Jude (serial number [id number]) pacemaker. The pocket was irrigated with copious gentamicin solution. The pacemaker was then placed into the pocket. The pocket was then closed in 2 layers with 2.0 Vicryl suture for the subcutaneous and subcuticular layers. Steri-Strips and a sterile dressing were then applied. There were no early apparent complications.     Estimated blood loss <50 mL.  During this procedure the patient was administered the following to achieve and maintain moderate conscious sedation: Versed 2 mg, Fentanyl 25 mcg, while the patient's heart rate, blood pressure, and oxygen saturation were continuously monitored. The period of conscious sedation was 21 minutes, of which I was present face-to-face 100% of this time.      _____________     History of Present Illness     Holly Chang is a 77 y.o. female with history of Anxiety, cataracts, chest pain with normal nuclear stress tests, and prior AV block who presented to [facility] with symptomatic bradycardia with HR in the 30s.  Hospital Course    She is known to our group with prior history of chest pain and normal stress testing. This occurred in 05/2016 as well  as more recently as 09/2017 (EF 64%). She previously wore a 24-hour Holter monitor in 2018 that showed normal sinus rhythm and first-degree AV block. She had occasional periods of 2:1 AV block, but the longest pause was  under 2 seconds in duration. This was managed conservatively, observing for worsening symptoms. She also previously had an EKG showing Mobitz type 1. It has been noted that she is under a lot of family stress in the past and has been encouraged to f/u PCP. She presented to [facility] this admission complaining of weakness and dizziness, without loss of consciousness. She had prior h/o ILBBB, but EKG this admission showed HR 36bpm, predominantly 2:1 AV block, with RBBB morphology. No reversible causes were identified. Recent OP TSH was normal. She underwent St. Jude (serial number [id number]) pacemaker implantation yesterday and tolerated this procedure well. She did have some pleuritic chest pain post procedure and was treated with Toradol. Dr. Ladona Ridgel recommended she take ibuprofen 400mg  BID with food for 2-3 days and can stop if pain resolves. He did not see evidence of any acute complications on exam or CXR, and she was hemodynamically stable. It was also noted she had very mildly abnormal liver function with mildly elevated bilriubin and decreased albumin/protein. Will defer further monitoring of this to primary care. Regarding her intermittent chest pain (independent of recent PPM placement), Dr. Ladona Ridgel recommended deferring additional workup to Dr. Royann Shivers. She already appears to have a wound check scheduled 9/16. I've sent a message to schedulers requesting f/u with Theodore Demark soon as well, and f/u with Dr. Ladona Ridgel in 3 months. Her blood pressure was slightly elevated this admission but review of OP BPs indicates this is quite variable, normal down to 114 systolic at times so this can be further managed as an outpatient. Dr. Ladona Ridgel has seen and examined the patient today and feels she is stable for discharge. Standard post-PPM instructions were given.  _____________  Discharge Vitals Blood pressure (!) 148/61, pulse 60, temperature 97.7 F (36.5 C), temperature source Oral, resp. rate 18, height 5\' 3"  (1.6 m),  weight 85.1 kg, SpO2 96 %.  Filed Weights   01/13/18 1551 01/14/18 0152  Weight: 72.6 kg 85.1 kg    Labs & Radiologic Studies    CBC Recent Labs    01/13/18 1556 01/13/18 1622  WBC 8.6  --   NEUTROABS 5.6  --   HGB 12.5 12.6  HCT 39.7 37.0  MCV 98.3  --   PLT 160  --    Basic Metabolic Panel Recent Labs    09/81/19 1556 01/13/18 1622 01/14/18 0409  NA 142 142 141  K 3.9 3.9 3.9  CL 110 108 112*  CO2 26  --  24  GLUCOSE 84 80 113*  BUN 11 13 12   CREATININE 0.97 0.90 0.83  CALCIUM 9.2  --  8.5*   Liver Function Tests Recent Labs    01/13/18 1556 01/14/18 0409  AST 21 18  ALT 24 20  ALKPHOS 98 84  BILITOT 1.3* 1.4*  PROT 6.2* 5.5*  ALBUMIN 3.5 3.0*   Recent Labs    01/13/18 1556  LIPASE 33  _____________  Dg Chest Port 1 View  Result Date: 01/13/2018 CLINICAL DATA:  Dyspnea. EXAM: PORTABLE CHEST 1 VIEW COMPARISON:  01/13/2018. FINDINGS: Normal sized heart. Tortuous and pallor sleep calcified thoracic aorta. Interval left subclavian bipolar pacemaker with satisfactory position of the leads. Clear lungs with normal vascularity. Thoracic spine degenerative changes. Diffuse osteopenia. IMPRESSION: no acute abnormality.  Electronically Signed   By: Beckie Salts M.D.   On: 01/13/2018 18:54   Dg Chest Port 1 View  Result Date: 01/13/2018 CLINICAL DATA:  Diffuse left chest pain and bradycardia. EXAM: PORTABLE CHEST 1 VIEW COMPARISON:  06/24/2016. FINDINGS: Normal sized heart. Clear lungs with normal vascularity. Thoracic spine and bilateral acromioclavicular joint degenerative changes. IMPRESSION: no acute abnormality. Electronically Signed   By: Beckie Salts M.D.   On: 01/13/2018 16:11   Disposition   Pt is being discharged home today in good condition.  Follow-up Plans & Appointments    Follow-up Information    [facility] Office Follow up.   Specialty:  Cardiology Why:  01/23/18 at 2pm for wound check visit with device clinic nurse. Arrive 15  minutes prior to appointment to check in. IMPORTANT NOTE: This is at our [facility] office. Contact information: [address] [place] [address] [phone]       Thurmon Fair, MD Follow up.   Specialty:  Cardiology Why:  Office will call you to schedule follow-up appointment Contact information: [address] [place] [address] [phone]          Discharge Instructions    Diet - low sodium heart healthy   Complete by:  As directed    Discharge instructions   Complete by:  As directed    Please see attached sheet at the end of your After-Visit Summary for instructions on wound care, activity, and bathing.  Dr. Ladona Ridgel wants you to take ibuprofen 2 tablets (400mg ) by mouth twice a day WITH FOOD for 2-3 days. You may stop if chest pain totally resolves. Call the office if persistent symptoms. This is available over-the-counter (brand names Advil or Motrin).   Increase activity slowly   Complete by:  As directed       Discharge Medications   Allergies as of 01/14/2018      Reactions   Penicillins Hives      Medication List    TAKE these medications   Co Q 10 10 MG Caps Take by mouth.   cyanocobalamin 1000 MCG tablet Take 5,000 mcg by mouth daily.   ibuprofen 200 MG tablet Commonly known as:  ADVIL,MOTRIN Dr. Ladona Ridgel wants you to take 2 tablets (400mg ) by mouth twice a day WITH FOOD for 2-3 days. You may stop if chest pain totally resolves. Call the office if persistent symptoms. This is available over-the-counter (brand names Advil or Motrin).   ketoconazole 2 % cream Commonly known as:  NIZORAL Apply 1 application topically daily.   Vitamin D3 1000 units Caps Take 5,000 Units by mouth daily.        Allergies:  Allergies  Allergen Reactions  . Penicillins Hives   Outstanding Labs/Studies   n/a  Duration of Discharge Encounter   Greater than 30 minutes including physician  time.  Signed, Laurann Montana PA-C 01/14/2018, 8:49 AM  EP Attending  Patient seen and examined. Agree with above. The patient is stable for DC home.   Leonia Reeves.D.
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
## 2018-01-14 NOTE — Progress Notes (Signed)
Progress Note  Patient Name: Holly Chang Date of Encounter: 01/14/2018  Primary Cardiologist: Thurmon Fair, MD   Subjective   Chest pain almost resolved.   Inpatient Medications    Scheduled Meds: . enoxaparin (LOVENOX) injection  40 mg Subcutaneous Q24H  . sodium chloride flush  3 mL Intravenous Q12H  . you have a pacemaker book   Does not apply Once   Continuous Infusions:  PRN Meds: acetaminophen, ALPRAZolam, ketorolac, nitroGLYCERIN, ondansetron (ZOFRAN) IV, sodium chloride flush, zolpidem   Vital Signs    Vitals:   01/13/18 1812 01/13/18 1915 01/14/18 0152 01/14/18 0703  BP: (!) 148/78 (!) 172/70 (!) 151/63 (!) 148/61  Pulse: 65 60 65 60  Resp: 16 16 19 18   Temp:  (!) 97.4 F (36.3 C) 97.6 F (36.4 C) 97.7 F (36.5 C)  TempSrc:  Oral Oral Oral  SpO2: 99% 100% 98% 96%  Weight:   85.1 kg   Height:        Intake/Output Summary (Last 24 hours) at 01/14/2018 0823 Last data filed at 01/14/2018 0226 Gross per 24 hour  Intake 360 ml  Output 1300 ml  Net -940 ml   Filed Weights   01/13/18 1551 01/14/18 0152  Weight: 72.6 kg 85.1 kg    Telemetry    nsr with AV pacing - Personally Reviewed  ECG    NSR with AV pacing - Personally Reviewed  Physical Exam   GEN: No acute distress.   Neck: No JVD Cardiac: RRR, no murmurs, rubs, or gallops.  Respiratory: Clear to auscultation bilaterally. No hemaotoma GI: Soft, nontender, non-distended  MS: No edema; No deformity. Neuro:  Nonfocal  Psych: Normal affect   Labs    Chemistry Recent Labs  Lab 01/13/18 1556 01/13/18 1622 01/14/18 0409  NA 142 142 141  K 3.9 3.9 3.9  CL 110 108 112*  CO2 26  --  24  GLUCOSE 84 80 113*  BUN 11 13 12   CREATININE 0.97 0.90 0.83  CALCIUM 9.2  --  8.5*  PROT 6.2*  --  5.5*  ALBUMIN 3.5  --  3.0*  AST 21  --  18  ALT 24  --  20  ALKPHOS 98  --  84  BILITOT 1.3*  --  1.4*  GFRNONAA 55*  --  >60  GFRAA >60  --  >60  ANIONGAP 6  --  5     Hematology Recent Labs   Lab 01/13/18 1556 01/13/18 1622  WBC 8.6  --   RBC 4.04  --   HGB 12.5 12.6  HCT 39.7 37.0  MCV 98.3  --   MCH 30.9  --   MCHC 31.5  --   RDW 13.2  --   PLT 160  --     Cardiac EnzymesNo results for input(s): TROPONINI in the last 168 hours.  Recent Labs  Lab 01/13/18 1620  TROPIPOC 0.02     BNPNo results for input(s): BNP, PROBNP in the last 168 hours.   DDimer No results for input(s): DDIMER in the last 168 hours.   Radiology    Dg Chest Port 1 View  Result Date: 01/13/2018 CLINICAL DATA:  Dyspnea. EXAM: PORTABLE CHEST 1 VIEW COMPARISON:  01/13/2018. FINDINGS: Normal sized heart. Tortuous and pallor sleep calcified thoracic aorta. Interval left subclavian bipolar pacemaker with satisfactory position of the leads. Clear lungs with normal vascularity. Thoracic spine degenerative changes. Diffuse osteopenia. IMPRESSION: no acute abnormality. Electronically Signed   By: Beckie Salts  M.D.   On: 01/13/2018 18:54   Dg Chest Port 1 View  Result Date: 01/13/2018 CLINICAL DATA:  Diffuse left chest pain and bradycardia. EXAM: PORTABLE CHEST 1 VIEW COMPARISON:  06/24/2016. FINDINGS: Normal sized heart. Clear lungs with normal vascularity. Thoracic spine and bilateral acromioclavicular joint degenerative changes. IMPRESSION: no acute abnormality. Electronically Signed   By: Beckie Salts M.D.   On: 01/13/2018 16:11    Cardiac Studies     Patient Profile     77 y.o. female admitted with symptomatic 2:1 AV block, s/p PPM  Assessment & Plan    1. 2:1 AV block - she is doing well, s/p PPM insertion 2. Chest pain - she had non-cardiac chest pain before and after her PPM. I have given her toradol and her pain almost resolved. She will undergo watchful waiting. I will defer additional workup to Dr. Salena Saner 3. PPM - her St. Jude DDD PM is working normally. CXR demonstrates no PTX and leads in good position. He PM interogation done under my direct supervision demonstrates that her device is  working normally.  4. Obesity - she needs to lose weight.  5. Disp. - she is stable for DC. I would like her to take some motrin 400 bid for 2-3 days with food.   For questions or updates, please contact [facility] Please consult [email or web address] for contact info under Cardiology/STEMI.      Signed, Lewayne Bunting, MD  01/14/2018, 8:23 AM  Patient ID: Holly Chang, female   DOB: 1941-04-09, 77 y.o.   MRN: [id number]

## 2018-01-14 NOTE — Discharge Instructions (Signed)
° ° °  Supplemental Discharge Instructions for  Pacemaker Patients  Activity No heavy lifting or vigorous activity with your left arm for 6 to 8 weeks.  Do not raise your left arm above your head for one week.  Gradually raise your affected arm as drawn below.               01/18/18                        01/19/18                       01/20/18                    01/21/18   NO DRIVING for 1 week    ; you may begin driving on   7/67/20  .  WOUND CARE - Keep the wound area clean and dry.  Do not get this area wet for one week. No showers for one week; you may shower on     . - The tape/steri-strips on your wound will fall off; do not pull them off.  No bandage is needed on the site.  DO  NOT apply any creams, oils, or ointments to the wound area. - If you notice any drainage or discharge from the wound, any swelling or bruising at the site, or you develop a fever > 101? F after you are discharged home, call the office at once.  Special Instructions - You are still able to use cellular telephones; use the ear opposite the side where you have your pacemaker/defibrillator.  Avoid carrying your cellular phone near your device. - When traveling through airports, show security personnel your identification card to avoid being screened in the metal detectors.  Ask the security personnel to use the hand wand. - Avoid arc welding equipment, MRI testing (magnetic resonance imaging), TENS units (transcutaneous nerve stimulators).  Call the office for questions about other devices. - Avoid electrical appliances that are in poor condition or are not properly grounded. - Microwave ovens are safe to be near or to operate.

## 2018-01-16 ENCOUNTER — Encounter (HOSPITAL_COMMUNITY): Payer: Self-pay | Admitting: Internal Medicine

## 2018-01-16 MED FILL — Fentanyl Citrate Preservative Free (PF) Inj 100 MCG/2ML: INTRAMUSCULAR | Qty: 2 | Status: AC

## 2018-01-17 ENCOUNTER — Telehealth: Payer: Self-pay

## 2018-01-17 NOTE — Telephone Encounter (Signed)
Per chart review patient has been admitted to the hospital due to symptomatic bradycardia.    Therefore, patient will be placed on the inactive list.  If services are needed again after the patient has been discharged from the hospital, please place another referral to [facility] services.

## 2018-01-18 ENCOUNTER — Ambulatory Visit: Payer: Medicare PPO | Admitting: *Deleted

## 2018-01-18 ENCOUNTER — Telehealth: Payer: Self-pay

## 2018-01-18 DIAGNOSIS — Z0130 Encounter for examination of blood pressure without abnormal findings: Secondary | ICD-10-CM

## 2018-01-18 NOTE — Telephone Encounter (Signed)
Writer consulted with Dr.. Hisada and we will continue to try to make contact with the patient in order to provide services.

## 2018-01-18 NOTE — Progress Notes (Signed)
Pt here for BP ck BP 157 79 P 71  rck BP 143 81 P 74  appt sched with PCP for follow up

## 2018-01-18 NOTE — Telephone Encounter (Signed)
She is referred to psychiatry. [facility] will contact the patient if she wants to be referred to [facility]. [facility] will sign off.

## 2018-01-23 ENCOUNTER — Telehealth: Payer: Self-pay

## 2018-01-23 ENCOUNTER — Ambulatory Visit (INDEPENDENT_AMBULATORY_CARE_PROVIDER_SITE_OTHER): Payer: Medicare PPO | Admitting: *Deleted

## 2018-01-23 DIAGNOSIS — I441 Atrioventricular block, second degree: Secondary | ICD-10-CM | POA: Diagnosis not present

## 2018-01-23 LAB — CUP PACEART INCLINIC DEVICE CHECK
Battery Voltage: 3.11 V
Implantable Lead Implant Date: 20190906
Implantable Lead Location: 753859
Implantable Pulse Generator Implant Date: 20190906
Lead Channel Impedance Value: 437.5 Ohm
Lead Channel Impedance Value: 575 Ohm
Lead Channel Pacing Threshold Amplitude: 0.5 V
Lead Channel Pacing Threshold Amplitude: 0.75 V
Lead Channel Pacing Threshold Amplitude: 0.75 V
Lead Channel Pacing Threshold Pulse Width: 0.5 ms
Lead Channel Pacing Threshold Pulse Width: 0.5 ms
Lead Channel Pacing Threshold Pulse Width: 0.5 ms
Lead Channel Sensing Intrinsic Amplitude: 3.1 mV
Lead Channel Sensing Intrinsic Amplitude: 9.7 mV
Lead Channel Setting Pacing Amplitude: 3.5 V
Lead Channel Setting Pacing Amplitude: 3.5 V
Lead Channel Setting Pacing Pulse Width: 0.5 ms
Lead Channel Setting Sensing Sensitivity: 2.5 mV
MDC IDC LEAD IMPLANT DT: 20190906
MDC IDC LEAD LOCATION: 753860
MDC IDC MSMT BATTERY REMAINING LONGEVITY: 73 mo
MDC IDC MSMT LEADCHNL RA PACING THRESHOLD AMPLITUDE: 0.5 V
MDC IDC MSMT LEADCHNL RA PACING THRESHOLD PULSEWIDTH: 0.5 ms
MDC IDC PG SERIAL: 9061887
MDC IDC SESS DTM: 20190916142512
MDC IDC STAT BRADY RA PERCENT PACED: 24 %
MDC IDC STAT BRADY RV PERCENT PACED: 99.96 %

## 2018-01-23 NOTE — Progress Notes (Signed)
Wound check appointment. Steri-strips removed. Wound without redness or edema. Incision edges approximated, wound well healed. Normal device function. Thresholds, sensing, and impedances consistent with implant measurements. Device programmed at 3.5V/auto capture programmed on for extra safety margin until 3 month visit. Histogram distribution appropriate for patient and level of activity. No mode switches or high ventricular rates noted. Patient educated about wound care, arm mobility, lifting restrictions. ROV 04/17/2018 w/ GT

## 2018-01-23 NOTE — Telephone Encounter (Signed)
2nd attempt - [facility]   

## 2018-01-25 ENCOUNTER — Ambulatory Visit (INDEPENDENT_AMBULATORY_CARE_PROVIDER_SITE_OTHER): Payer: Medicare PPO | Admitting: Family Medicine

## 2018-01-25 ENCOUNTER — Encounter: Payer: Self-pay | Admitting: Family Medicine

## 2018-01-25 VITALS — BP 151/83 | HR 63 | Temp 96.8°F | Ht 63.0 in | Wt 184.0 lb

## 2018-01-25 DIAGNOSIS — N8110 Cystocele, unspecified: Secondary | ICD-10-CM | POA: Diagnosis not present

## 2018-01-25 DIAGNOSIS — Z09 Encounter for follow-up examination after completed treatment for conditions other than malignant neoplasm: Secondary | ICD-10-CM

## 2018-01-25 DIAGNOSIS — R030 Elevated blood-pressure reading, without diagnosis of hypertension: Secondary | ICD-10-CM

## 2018-01-25 DIAGNOSIS — R1013 Epigastric pain: Secondary | ICD-10-CM | POA: Diagnosis not present

## 2018-01-25 DIAGNOSIS — Z950 Presence of cardiac pacemaker: Secondary | ICD-10-CM | POA: Diagnosis not present

## 2018-01-25 MED ORDER — PANTOPRAZOLE SODIUM 40 MG PO TBEC: 40.0000 mg | DELAYED_RELEASE_TABLET | Freq: Every day | ORAL | 0 refills | Status: DC
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
## 2018-01-25 NOTE — Patient Instructions (Addendum)
Monitor your blood pressures closely.  Your goal is <150/90.   How to Take Your Blood Pressure You can take your blood pressure at home with a machine. You may need to check your blood pressure at home:  To check if you have high blood pressure (hypertension).  To check your blood pressure over time.  To make sure your blood pressure medicine is working.  Supplies needed: You will need a blood pressure machine, or monitor. You can buy one at a drugstore or online. When choosing one:  Choose one with an arm cuff.  Choose one that wraps around your upper arm. Only one finger should fit between your arm and the cuff.  Do not choose one that measures your blood pressure from your wrist or finger.  Your doctor can suggest a monitor. How to prepare Avoid these things for 30 minutes before checking your blood pressure:  Drinking caffeine.  Drinking alcohol.  Eating.  Smoking.  Exercising.  Five minutes before checking your blood pressure:  Pee.  Sit in a dining chair. Avoid sitting in a soft couch or armchair.  Be quiet. Do not talk.  How to take your blood pressure Follow the instructions that came with your machine. If you have a digital blood pressure monitor, these may be the instructions: 1. Sit up straight. 2. Place your feet on the floor. Do not cross your ankles or legs. 3. Rest your left arm at the level of your heart. You may rest it on a table, desk, or chair. 4. Pull up your shirt sleeve. 5. Wrap the blood pressure cuff around the upper part of your left arm. The cuff should be 1 inch (2.5 cm) above your elbow. It is best to wrap the cuff around bare skin. 6. Fit the cuff snugly around your arm. You should be able to place only one finger between the cuff and your arm. 7. Put the cord inside the groove of your elbow. 8. Press the power button. 9. Sit quietly while the cuff fills with air and loses air. 10. Write down the numbers on the screen. 11. Wait 2-3  minutes and then repeat steps 1-10.  What do the numbers mean? Two numbers make up your blood pressure. The first number is called systolic pressure. The second is called diastolic pressure. An example of a blood pressure reading is "120 over 80" (or 120/80). If you are an adult and do not have a medical condition, use this guide to find out if your blood pressure is normal: Normal  First number: below 120.  Second number: below 80. Elevated  First number: 120-129.  Second number: below 80. Hypertension stage 1  First number: 130-139.  Second number: 80-89. Hypertension stage 2  First number: 140 or above.  Second number: 90 or above. Your blood pressure is above normal even if only the top or bottom number is above normal. Follow these instructions at home:  Check your blood pressure as often as your doctor tells you to.  Take your monitor to your next doctor's appointment. Your doctor will: ? Make sure you are using it correctly. ? Make sure it is working right.  Make sure you understand what your blood pressure numbers should be.  Tell your doctor if your medicines are causing side effects. Contact a doctor if:  Your blood pressure keeps being high. Get help right away if:  Your first blood pressure number is higher than 180.  Your second blood pressure number is  higher than 120. This information is not intended to replace advice given to you by your health care provider. Make sure you discuss any questions you have with your health care provider. Document Released: 04/08/2008 Document Revised: 03/24/2016 Document Reviewed: 10/03/2015 Elsevier Interactive Patient Education  2018 Elsevier Inc.   Gastroesophageal Reflux Disease, Adult Normally, food travels down the esophagus and stays in the stomach to be digested. If a person has gastroesophageal reflux disease (GERD), food and stomach acid move back up into the esophagus. When this happens, the esophagus  becomes sore and swollen (inflamed). Over time, GERD can make small holes (ulcers) in the lining of the esophagus. Follow these instructions at home: Diet  Follow a diet as told by your doctor. You may need to avoid foods and drinks such as: ? Coffee and tea (with or without caffeine). ? Drinks that contain alcohol. ? Energy drinks and sports drinks. ? Carbonated drinks or sodas. ? Chocolate and cocoa. ? Peppermint and mint flavorings. ? Garlic and onions. ? Horseradish. ? Spicy and acidic foods, such as peppers, chili powder, curry powder, vinegar, hot sauces, and BBQ sauce. ? Citrus fruit juices and citrus fruits, such as oranges, lemons, and limes. ? Tomato-based foods, such as red sauce, chili, salsa, and pizza with red sauce. ? Fried and fatty foods, such as donuts, french fries, potato chips, and high-fat dressings. ? High-fat meats, such as hot dogs, rib eye steak, sausage, ham, and bacon. ? High-fat dairy items, such as whole milk, butter, and cream cheese.  Eat small meals often. Avoid eating large meals.  Avoid drinking large amounts of liquid with your meals.  Avoid eating meals during the 2-3 hours before bedtime.  Avoid lying down right after you eat.  Do not exercise right after you eat. General instructions  Pay attention to any changes in your symptoms.  Take over-the-counter and prescription medicines only as told by your doctor. Do not take aspirin, ibuprofen, or other NSAIDs unless your doctor says it is okay.  Do not use any tobacco products, including cigarettes, chewing tobacco, and e-cigarettes. If you need help quitting, ask your doctor.  Wear loose clothes. Do not wear anything tight around your waist.  Raise (elevate) the head of your bed about 6 inches (15 cm).  Try to lower your stress. If you need help doing this, ask your doctor.  If you are overweight, lose an amount of weight that is healthy for you. Ask your doctor about a safe weight loss  goal.  Keep all follow-up visits as told by your doctor. This is important. Contact a doctor if:  You have new symptoms.  You lose weight and you do not know why it is happening.  You have trouble swallowing, or it hurts to swallow.  You have wheezing or a cough that keeps happening.  Your symptoms do not get better with treatment.  You have a hoarse voice. Get help right away if:  You have pain in your arms, neck, jaw, teeth, or back.  You feel sweaty, dizzy, or light-headed.  You have chest pain or shortness of breath.  You throw up (vomit) and your throw up looks like blood or coffee grounds.  You pass out (faint).  Your poop (stool) is bloody or black.  You cannot swallow, drink, or eat. This information is not intended to replace advice given to you by your health care provider. Make sure you discuss any questions you have with your health care provider. Document Released:  10/13/2007 Document Revised: 10/02/2015 Document Reviewed: 08/21/2014 Elsevier Interactive Patient Education  [employer].

## 2018-01-26 NOTE — Progress Notes (Signed)
Thanks for the message, The pacemaker should not increase her BP. Having said that, her BP was rather variable during her hospitalization. We will make sure we bring her back in sooner. Bjorn LoserRhonda will probably have availability sooner than I do. Thanks again, B[employer]on, [employer] and CompanyMihai

## 2018-01-27 ENCOUNTER — Telehealth: Payer: Self-pay | Admitting: *Deleted

## 2018-01-27 ENCOUNTER — Telehealth: Payer: Self-pay | Admitting: Cardiovascular Disease

## 2018-01-27 NOTE — Telephone Encounter (Signed)
New message  ° °No message needed °

## 2018-01-27 NOTE — Telephone Encounter (Signed)
Left message for patient to call and schedule post hospital visit with APP

## 2018-02-08 ENCOUNTER — Ambulatory Visit: Payer: Medicare PPO | Admitting: Physician Assistant

## 2018-02-08 ENCOUNTER — Encounter: Payer: Self-pay | Admitting: Physician Assistant

## 2018-02-08 VITALS — BP 146/79 | HR 76 | Ht 63.0 in | Wt 182.8 lb

## 2018-02-08 DIAGNOSIS — R001 Bradycardia, unspecified: Secondary | ICD-10-CM | POA: Diagnosis not present

## 2018-02-08 DIAGNOSIS — Z950 Presence of cardiac pacemaker: Secondary | ICD-10-CM | POA: Diagnosis not present

## 2018-02-08 NOTE — Patient Instructions (Signed)
Holly Chang, Holly Chang recommends that you schedule a follow-up appointment in FEBRUARY 2020.  If you need a refill on your cardiac medications before your next appointment, please call your pharmacy.
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
## 2018-02-10 ENCOUNTER — Encounter: Payer: Self-pay | Admitting: Physician Assistant

## 2018-02-13 ENCOUNTER — Telehealth: Payer: Self-pay

## 2018-02-13 NOTE — Telephone Encounter (Signed)
Several attempts have been made to contact patient without success. Patient will be placed on the inactive list.  If services are needed again.  Please contact [facility] at [phone].    Information will be routed to the PCP and Dr. Alinda Dooms unable to reach the patient in order to schedule the appointment with Dr. Vanetta Shawl in [place].

## 2018-03-02 ENCOUNTER — Encounter: Payer: Medicare PPO | Admitting: Obstetrics & Gynecology

## 2018-03-14 ENCOUNTER — Ambulatory Visit: Payer: Medicare PPO | Admitting: *Deleted

## 2018-03-15 ENCOUNTER — Encounter: Payer: Self-pay | Admitting: Family Medicine

## 2018-03-15 ENCOUNTER — Ambulatory Visit (INDEPENDENT_AMBULATORY_CARE_PROVIDER_SITE_OTHER): Payer: Medicare PPO | Admitting: Family Medicine

## 2018-03-15 VITALS — BP 129/77 | HR 65 | Temp 97.5°F | Ht 63.0 in | Wt 189.0 lb

## 2018-03-15 DIAGNOSIS — Z23 Encounter for immunization: Secondary | ICD-10-CM

## 2018-03-15 DIAGNOSIS — K5904 Chronic idiopathic constipation: Secondary | ICD-10-CM | POA: Diagnosis not present

## 2018-03-15 MED ORDER — POLYETHYLENE GLYCOL 3350 17 GM/SCOOP PO POWD: 17.0000 g | Freq: Every day | ORAL | 1 refills | Status: DC | PRN

## 2018-03-15 NOTE — Progress Notes (Signed)
Subjective: CC: Stomach pain PCP: Holly Chang [id number] Holly Chang is a 77 y.o. female presenting to clinic today for:  1. Stomach pain Has been an ongoing issue for patient.  In November 2018, KUB was obtained which demonstrated large stool burden but no other acute findings to explain symptoms.    At one time thought to possibly be related to bladder prolapse and she was referred to gynecology for this.  Apparently she no showed to the appointment on 03/02/2018.  She notes that she missed the appointment because of various things going on at home but does wish to re-schedule.  She goes on to state that the abdominal pain occurs within the lower abdomen at random.  It occurs roughly 2-3 times per week.  She does report constipation and states that her stools Chang not look normal.  She describes very small caliber stools and pellet-like stools.  Denies any rectal bleeding.  No nausea, vomiting, difficulty with p.o. intake.  No fevers.  She has not tried taking anything for constipation, nor does she drink much water during the day but she knows that she needs to.   ROS: Per HPI  Allergies  Allergen Reactions  . Penicillins Hives   Past Medical History:  Diagnosis Date  . H/O cardiovascular stress test    Normal in 2018, 09/2017  . 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 Presence of permanent cardiac pacemaker 01/13/2018  . Second degree AV block, Mobitz type II    a. s/p St. Jude PPM 01/2018.  . Stroke [facility]) 2018   Diagnosed by her cousin, who is a Engineer, civil (consulting). Did not see a doctor. Sx resolved.   . Symptomatic bradycardia     Current Outpatient Medications:  .  Cholecalciferol (VITAMIN D3) 1000 units CAPS, Take 5,000 Units by mouth daily. , Disp: , Rfl:  .  Coenzyme Q10 (CO Q 10) 10 MG CAPS, Take by mouth., Disp: , Rfl:  .  cyanocobalamin 1000 MCG tablet, Take 5,000 mcg by mouth daily. , Disp: , Rfl:  .  ketoconazole (NIZORAL) 2 % cream,  Apply 1 application topically daily., Disp: 30 g, Rfl: 1 .  pantoprazole (PROTONIX) 40 MG tablet, Take 1 tablet (40 mg total) by mouth daily., Disp: 30 tablet, Rfl: 0 Social History   Socioeconomic History  . Marital status: Divorced    Spouse name: Not on file  . Number of children: Not on file  . Years of education: Not on file  . Highest education level: Not on file  Occupational History  . Not on file  Social Needs  . Financial resource strain: Not on file  . Food insecurity:    Worry: Not on file    Inability: Not on file  . Transportation needs:    Medical: Not on file    Non-medical: Not on file  Tobacco Use  . Smoking status: Never Smoker  . Smokeless tobacco: Never Used  Substance and Sexual Activity  . Alcohol use: No  . Drug use: No  . Sexual activity: Not Currently  Lifestyle  . Physical activity:    Days per week: Not on file    Minutes per session: Not on file  . Stress: Not on file  Relationships  . Social connections:    Talks on phone: Not on file    Gets together: Not on file    Attends religious service: Not on file    Active member of club  or organization: Not on file    Attends meetings of clubs or organizations: Not on file    Relationship status: Not on file  . Intimate partner violence:    Fear of current or ex partner: Not on file    Emotionally abused: Not on file    Physically abused: Not on file    Forced sexual activity: Not on file  Other Topics Concern  . Not on file  Social History Narrative  . Not on file   Family History  Problem Relation Age of Onset  . Pulmonary fibrosis Mother   . Heart disease Father   . Alcohol abuse Father   . Cancer Paternal Uncle        skin    Objective: Office vital signs reviewed. BP 129/77   Pulse 65   Temp (!) 97.5 F (36.4 C) (Oral)   Ht 5\' 3"  (1.6 m)   Wt 189 lb (85.7 kg)   BMI 33.48 kg/m   Physical Examination:  General: Awake, alert, nontoxic. No acute distress HEENT: Normal     Eyes: PERRLA, extraocular membranes intact, sclera white    Throat: moist mucus membranes GI: soft, non-tender, mild generalized TTP. bowel sounds present x4, no hepatomegaly, no splenomegaly, no masses GU: no suprapubic TTP  Assessment/ Plan: 77 y.o. female   1. Chronic idiopathic constipation Seemingly consistent with constipation.  Differential diagnosis considered include acute appendicitis, acute cholecystitis, pain from pelvic organs.  I have recommended increasing consumption of water and use of MiraLAX.  This has been prescribed.  I have given her the number to recontact the OB/GYN to schedule an appointment for concerns of pelvic prolapse.  Reasons for return discussed.  She will follow-up with me as needed.  2. Encounter for immunization - Flu vaccine HIGH DOSE PF   Orders Placed This Encounter  Procedures  . Flu vaccine HIGH DOSE PF   Meds ordered this encounter  Medications  . polyethylene glycol powder (GLYCOLAX/MIRALAX) powder    Sig: Take 17 g by mouth daily as needed for moderate constipation.    Dispense:  225 g    Refill:  1     Holly Chang Holly Skains, Chang [facility] [phone]

## 2018-03-15 NOTE — Patient Instructions (Addendum)
OB/GYN information: Address: [address] [place], [place] [address] Phone: [phone]  Constipation, Adult Constipation is when a person:  Poops (has a bowel movement) fewer times in a week than normal.  Has a hard time pooping.  Has poop that is dry, hard, or bigger than normal.  Follow these instructions at home: Eating and drinking   Eat foods that have a lot of fiber, such as: ? Fresh fruits and vegetables. ? Whole grains. ? Beans.  Eat less of foods that are high in fat, low in fiber, or overly processed, such as: ? [place] fries. ? Hamburgers. ? Cookies. ? Candy. ? Soda.  Drink enough fluid to keep your pee (urine) clear or pale yellow. General instructions  Exercise regularly or as told by your doctor.  Go to the restroom when you feel like you need to poop. Do not hold it in.  Take over-the-counter and prescription medicines only as told by your doctor. These include any fiber supplements.  Do pelvic floor retraining exercises, such as: ? Doing deep breathing while relaxing your lower belly (abdomen). ? Relaxing your pelvic floor while pooping.  Watch your condition for any changes.  Keep all follow-up visits as told by your doctor. This is important. Contact a doctor if:  You have pain that gets worse.  You have a fever.  You have not pooped for 4 days.  You throw up (vomit).  You are not hungry.  You lose weight.  You are bleeding from the anus.  You have thin, pencil-like poop (stool). Get help right away if:  You have a fever, and your symptoms suddenly get worse.  You leak poop or have blood in your poop.  Your belly feels hard or bigger than normal (is bloated).  You have very bad belly pain.  You feel dizzy or you faint. This information is not intended to replace advice given to you by your health care provider. Make sure you discuss any questions you have with your health care provider. Document Released: 10/13/2007  Document Revised: 11/14/2015 Document Reviewed: 10/15/2015 Elsevier Interactive Patient Education  2018 [employer].

## 2018-03-16 ENCOUNTER — Encounter: Payer: Self-pay | Admitting: Family Medicine

## 2018-04-12 ENCOUNTER — Encounter: Payer: Self-pay | Admitting: Family Medicine

## 2018-04-12 ENCOUNTER — Ambulatory Visit (INDEPENDENT_AMBULATORY_CARE_PROVIDER_SITE_OTHER): Payer: Medicare PPO | Admitting: Family Medicine

## 2018-04-12 VITALS — BP 143/77 | HR 68 | Temp 97.0°F | Ht 63.0 in | Wt 193.2 lb

## 2018-04-12 DIAGNOSIS — G5603 Carpal tunnel syndrome, bilateral upper limbs: Secondary | ICD-10-CM

## 2018-04-12 NOTE — Patient Instructions (Signed)
Carpal Tunnel Syndrome Carpal tunnel syndrome is a condition that causes pain in your hand and arm. The carpal tunnel is a narrow area that is on the palm side of your wrist. Repeated wrist motion or certain diseases may cause swelling in the tunnel. This swelling can pinch the main nerve in the wrist (median nerve). Follow these instructions at home: If you have a splint:  Wear it as told by your doctor. Remove it only as told by your doctor.  Loosen the splint if your fingers: ? Become numb and tingle. ? Turn blue and cold.  Keep the splint clean and dry. General instructions  Take over-the-counter and prescription medicines only as told by your doctor.  Rest your wrist from any activity that may be causing your pain. If needed, talk to your employer about changes that can be made in your work, such as getting a wrist pad to use while typing.  If directed, apply ice to the painful area: ? Put ice in a plastic bag. ? Place a towel between your skin and the bag. ? Leave the ice on for 20 minutes, 2-3 times per day.  Keep all follow-up visits as told by your doctor. This is important.  Do any exercises as told by your doctor, physical therapist, or occupational therapist. Contact a doctor if:  You have new symptoms.  Medicine does not help your pain.  Your symptoms get worse. This information is not intended to replace advice given to you by your health care provider. Make sure you discuss any questions you have with your health care provider. Document Released: 04/15/2011 Document Revised: 10/02/2015 Document Reviewed: 09/11/2014 Elsevier Interactive Patient Education  2018 Elsevier Inc.  

## 2018-04-12 NOTE — Progress Notes (Signed)
BP (!) 143/77   Pulse 68   Temp (!) 97 F (36.1 C) (Oral)   Ht 5\' 3"  (1.6 m)   Wt 193 lb 3.2 oz (87.6 kg)   BMI 34.22 kg/m    Subjective:    Patient ID: Holly Chang, female    DOB: 05/24/1940, 77 y.o.   MRN: [id number]  HPI: Holly Chang is a 77 y.o. female presenting on 04/12/2018 for poor hand circulation (bilateral x 1 month. Patient states it is worse at night)   HPI Bilateral hand numbness and feeling cold Patient comes in complaining of bilateral hand numbness and cold feeling that happens at night when she sleeps.  She says when she sleeps in a chair it does not happen but when she sleeps in her bed the way that she sleeps makes her hands feel like they go numb on both sides.  She cannot say that one side is worse than the other.  She says this is been going on for about a month since she had her pacemaker changed out she does not know if it would be correlated to that.  Relevant past medical, surgical, family and social history reviewed and updated as indicated. Interim medical history since our last visit reviewed. Allergies and medications reviewed and updated.  Review of Systems  Constitutional: Negative for chills and fever.  Eyes: Negative for visual disturbance.  Respiratory: Negative for chest tightness and shortness of breath.   Cardiovascular: Negative for chest pain and leg swelling.  Gastrointestinal: Negative for abdominal pain.  Musculoskeletal: Negative for back pain and gait problem.  Skin: Negative for rash.  Neurological: Positive for numbness. Negative for dizziness, weakness, light-headedness and headaches.  Psychiatric/Behavioral: Negative for agitation and behavioral problems.  All other systems reviewed and are negative.   Per HPI unless specifically indicated above   Allergies as of 04/12/2018      Reactions   Penicillins Hives      Medication List        Accurate as of 04/12/18  9:40 AM. Always use your most recent med list.            Co Q 10 10 MG Caps Take by mouth.   cyanocobalamin 1000 MCG tablet Take 5,000 mcg by mouth daily.   ketoconazole 2 % cream Commonly known as:  NIZORAL Apply 1 application topically daily.   pantoprazole 40 MG tablet Commonly known as:  PROTONIX Take 1 tablet (40 mg total) by mouth daily.   polyethylene glycol powder powder Commonly known as:  GLYCOLAX/MIRALAX Take 17 g by mouth daily as needed for moderate constipation.   Vitamin D3 25 MCG (1000 UT) Caps Take 5,000 Units by mouth daily.            Durable Medical Equipment  (From admission, onward)         Start     Ordered   04/12/18 0000  DME Other see comment    Comments:  Carpal tunnel wrist brace, bilateral, diagnosis bilateral carpal tunnel syndrome, wear at night   04/12/18 0940             Objective:    BP (!) 143/77   Pulse 68   Temp (!) 97 F (36.1 C) (Oral)   Ht 5\' 3"  (1.6 m)   Wt 193 lb 3.2 oz (87.6 kg)   BMI 34.22 kg/m   Wt Readings from Last 3 Encounters:  04/12/18 193 lb 3.2 oz (87.6 kg)  03/15/18 189 lb (85.7  kg)  02/08/18 182 lb 12.8 oz (82.9 kg)    Physical Exam  Constitutional: She is oriented to person, place, and time. She appears well-developed and well-nourished. No distress.  Eyes: Conjunctivae are normal.  Cardiovascular:  Pulses:      Radial pulses are 2+ on the right side, and 2+ on the left side.  Musculoskeletal:  Positive Tinel sign on the left hand, positive Phalen sign on the left hand.  Neurological: She is alert and oriented to person, place, and time. Coordination normal.  Skin: Skin is warm and dry. No rash noted. She is not diaphoretic. No pallor.  Psychiatric: She has a normal mood and affect. Her behavior is normal.  Nursing note and vitals reviewed.       Assessment & Plan:   Problem List Items Addressed This Visit    None    Visit Diagnoses    Bilateral carpal tunnel syndrome    -  Primary   Relevant Orders   DME Other see comment      Sounds  like based on symptoms and happening at night but this is likely carpal tunnel syndrome, patient does admit that she had it once in the past and was treated for it once in the past.  She will go see her cardiologist later this month and I am said to mention whether he thought it was a circulation problem to him but as far as I am concerned it sounds mostly like carpal tunnel. Follow up plan: Return if symptoms worsen or fail to improve.  Counseling provided for all of the vaccine components Orders Placed This Encounter  Procedures  . DME Other see comment    Arville CareJoshua Henya Aguallo, MD [facility] 04/12/2018, 9:40 AM

## 2018-04-17 ENCOUNTER — Ambulatory Visit (INDEPENDENT_AMBULATORY_CARE_PROVIDER_SITE_OTHER): Payer: Medicare PPO | Admitting: Internal Medicine

## 2018-04-17 ENCOUNTER — Encounter: Payer: Self-pay | Admitting: Internal Medicine

## 2018-04-17 VITALS — BP 158/90 | HR 74 | Ht 63.0 in | Wt 193.0 lb

## 2018-04-17 DIAGNOSIS — R001 Bradycardia, unspecified: Secondary | ICD-10-CM

## 2018-04-17 DIAGNOSIS — Z950 Presence of cardiac pacemaker: Secondary | ICD-10-CM | POA: Diagnosis not present

## 2018-04-17 NOTE — Patient Instructions (Addendum)
Medication Instructions:  Your physician recommends that you continue on your current medications as directed. Please refer to the Current Medication list given to you today.  Labwork: None ordered.  Testing/Procedures: None ordered.  Follow-Up: Your physician wants you to follow-up in: 9 months with Dr. Ladona Ridgelaylor in .   You will receive a reminder letter in the mail two months in advance. If you don't receive a letter, please call our office to schedule the follow-up appointment.  Remote monitoring is used to monitor your Pacemaker from home. This monitoring reduces the number of office visits required to check your device to one time per year. It allows us to keep an eye on the functioning of your device to ensure it is working properly. You are scheduled for a device check from home on 07/17/2018. You may send your transmission at any time that day. If you have a wireless device, the transmission will be sent automatically. After your physician reviews your transmission, you will receive a postcard with your next transmission date.  Any Other Special Instructions Will Be Listed Below (If Applicable).  If you need a refill on your cardiac medications before your next appointment, please call your pharmacy.

## 2018-04-17 NOTE — Progress Notes (Signed)
HPI Ms. Holly Chang is a pleasant, anxious 77 yo woman with a h/o symptomatic bradycardia and LBBB, who was found to have HR's in the 30's and underwent PPM insertion approx 4 months ago. She has been stable in the interim with no chest pain. She is anxious and is concerned about her sister who she thinks is trying to do her harm. She has had some problems with her hand and been diagnosed with carpal tunnel.  Allergies  Allergen Reactions  . Penicillins Hives     Current Outpatient Medications  Medication Sig Dispense Refill  . Cholecalciferol (VITAMIN D3) 1000 units CAPS Take 5,000 Units by mouth daily.     . Coenzyme Q10 (CO Q 10) 10 MG CAPS Take by mouth.    . cyanocobalamin 1000 MCG tablet Take 5,000 mcg by mouth daily.     Marland Kitchen. ketoconazole (NIZORAL) 2 % cream Apply 1 application topically daily. 30 g 1  . pantoprazole (PROTONIX) 40 MG tablet Take 1 tablet (40 mg total) by mouth daily. 30 tablet 0  . polyethylene glycol powder (GLYCOLAX/MIRALAX) powder Take 17 g by mouth daily as needed for moderate constipation. 225 g 1   No current facility-administered medications for this visit.      Past Medical History:  Diagnosis Date  . H/O cardiovascular stress test    Normal in 2018, 09/2017  . Mobitz (type) I [facility] atrioventricular block    [name]/notes 01/13/2018  . Presence of permanent cardiac pacemaker 01/13/2018  . Second degree AV block, Mobitz type II    a. s/p St. Jude PPM 01/2018.  . Stroke [facility] 2018   Diagnosed by her cousin, who is a Engineer, civil (consulting)nurse. Did not see a doctor. Sx resolved.   . Symptomatic bradycardia     ROS:   All systems reviewed and negative except as noted in the HPI.   Past Surgical History:  Procedure Laterality Date  . ABDOMINAL HYSTERECTOMY    . CATARACT EXTRACTION W/ INTRAOCULAR LENS  IMPLANT, BILATERAL Bilateral   . INSERT / REPLACE / REMOVE PACEMAKER  01/13/2018  . PACEMAKER IMPLANT N/A 01/13/2018   Procedure: PACEMAKER IMPLANT;  Surgeon: Marinus Mawaylor,   W, MD;  [facility];  Service: Cardiovascular;  Laterality: N/A;     Family History  Problem Relation Age of Onset  . Pulmonary fibrosis Mother   . Heart disease Father   . Alcohol abuse Father   . Cancer Paternal Uncle        skin     Social History   Socioeconomic History  . Marital status: Divorced    Spouse name: Not on file  . Number of children: Not on file  . Years of education: Not on file  . Highest education level: Not on file  Occupational History  . Not on file  Social Needs  . Financial resource strain: Not on file  . Food insecurity:    Worry: Not on file    Inability: Not on file  . Transportation needs:    Medical: Not on file    Non-medical: Not on file  Tobacco Use  . Smoking status: Never Smoker  . Smokeless tobacco: Never Used  Substance and Sexual Activity  . Alcohol use: No  . Drug use: No  . Sexual activity: Not Currently  Lifestyle  . Physical activity:    Days per week: Not on file    Minutes per session: Not on file  . Stress: Not on file  Relationships  . Social connections:    Talks on phone: Not on file    Gets together: Not on file    Attends religious service: Not on file    Active member of club or organization: Not on file    Attends meetings of clubs or organizations: Not on file    Relationship status: Not on file  . Intimate partner violence:    Fear of current or ex partner: Not on file    Emotionally abused: Not on file    Physically abused: Not on file    Forced sexual activity: Not on file  Other Topics Concern  . Not on file  Social History Narrative  . Not on file     BP (!) 158/90   Pulse 74   Ht 5\' 3"  (1.6 m)   Wt 193 lb (87.5 kg)   BMI 34.19 kg/m   Physical Exam:  Well appearing overweight 77 yo woman, NAD HEENT: Unremarkable Neck:  No JVD, no thyromegally Lymphatics:  No adenopathy Back:  No CVA tenderness Lungs:  Clear with no wheezes HEART:  Regular rate rhythm, no  murmurs, no rubs, no clicks Abd:  soft, positive bowel sounds, no organomegally, no rebound, no guarding Ext:  2 plus pulses, no edema, no cyanosis, no clubbing Skin:  No rashes no nodules Neuro:  CN II through XII intact, motor grossly intact   DEVICE  Normal device function.  See PaceArt for details.   Assess/Plan: 1. 2:1 AV block - her conduction remains impaired and has not improved. She is pacing over 99% of the time. She will continue her current meds. 2. HTN - her blood pressure is elevated. She is anxious about going to see the doctor and at home it is better. I will defer initiation of bp meds to Dr. Royann Shivers. 3. Obesity - she is encouraged to lose weight.  Leonia Reeves.D.

## 2018-04-26 ENCOUNTER — Ambulatory Visit: Payer: Medicare PPO | Admitting: Family Medicine

## 2018-04-29 LAB — CUP PACEART INCLINIC DEVICE CHECK
Battery Voltage: 3.01 V
Brady Statistic RA Percent Paced: 8.1 %
Implantable Lead Implant Date: 20190906
Implantable Lead Implant Date: 20190906
Implantable Lead Location: 753860
Lead Channel Impedance Value: 575 Ohm
Lead Channel Impedance Value: 587.5 Ohm
Lead Channel Pacing Threshold Pulse Width: 0.5 ms
Lead Channel Pacing Threshold Pulse Width: 0.5 ms
Lead Channel Sensing Intrinsic Amplitude: 10.5 mV
Lead Channel Sensing Intrinsic Amplitude: 3.1 mV
Lead Channel Setting Pacing Amplitude: 1.75 V
Lead Channel Setting Sensing Sensitivity: 2.5 mV
MDC IDC LEAD LOCATION: 753859
MDC IDC MSMT BATTERY REMAINING LONGEVITY: 128 mo
MDC IDC MSMT LEADCHNL RA PACING THRESHOLD AMPLITUDE: 0.75 V
MDC IDC MSMT LEADCHNL RV PACING THRESHOLD AMPLITUDE: 0.5 V
MDC IDC PG IMPLANT DT: 20190906
MDC IDC SESS DTM: 20191209173114
MDC IDC SET LEADCHNL RV PACING AMPLITUDE: 0.75 V
MDC IDC SET LEADCHNL RV PACING PULSEWIDTH: 0.5 ms
MDC IDC STAT BRADY RV PERCENT PACED: 99.93 %
Pulse Gen Serial Number: 9061887

## 2018-05-12 ENCOUNTER — Encounter: Payer: Self-pay | Admitting: Family Medicine

## 2018-05-31 ENCOUNTER — Encounter: Payer: Self-pay | Admitting: Physician Assistant

## 2018-05-31 ENCOUNTER — Ambulatory Visit: Payer: Medicare PPO | Admitting: Physician Assistant

## 2018-05-31 VITALS — BP 120/72 | HR 82 | Ht 63.0 in | Wt 192.0 lb

## 2018-05-31 DIAGNOSIS — F419 Anxiety disorder, unspecified: Secondary | ICD-10-CM | POA: Diagnosis not present

## 2018-05-31 DIAGNOSIS — R0600 Dyspnea, unspecified: Secondary | ICD-10-CM

## 2018-05-31 DIAGNOSIS — Z950 Presence of cardiac pacemaker: Secondary | ICD-10-CM | POA: Diagnosis not present

## 2018-05-31 NOTE — Patient Instructions (Signed)
Medication Instructions:  The current medical regimen is effective;  continue present plan and medications.  If you need a refill on your cardiac medications before your next appointment, please call your pharmacy.   Follow-Up: At [facility], you and your health needs are our priority.  As part of our continuing mission to provide you with exceptional heart care, we have created designated Provider Care Teams.  These Care Teams include your primary Cardiologist (physician) and Advanced Practice Providers (APPs -  Physician Assistants and Nurse Practitioners) who all work together to provide you with the care you need, when you need it. You will need a follow up appointment in 3 months. You may see Thurmon Fair, MD or one of the following Advanced Practice Providers on your designated Care Team: [place], [place] . Micah Flesher, PA-C
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
## 2018-06-02 ENCOUNTER — Encounter: Payer: Self-pay | Admitting: Physician Assistant

## 2018-06-05 NOTE — Progress Notes (Signed)
Only needs device follow up. No need for a second Cardiologist. MCr

## 2018-06-06 NOTE — Congregational Nurse Program (Signed)
  Dept: [phone]   Congregational Nurse Program Note  Date of Encounter: 06/06/2018  Past Medical History: Past Medical History:  Diagnosis Date  . H/O cardiovascular stress test    Normal in 2018, 09/2017  . Mobitz (type) I [facility]) atrioventricular block    [name] 01/13/2018  . Presence of permanent cardiac pacemaker 01/13/2018  . Second degree AV block, Mobitz type II    a. s/p St. Jude PPM 01/2018.  . Stroke [facility]) 2018   Diagnosed by her cousin, who is a Engineer, civil (consulting). Did not see a doctor. Sx resolved.   . Symptomatic bradycardia     Encounter Details: CNP Questionnaire - 05/25/18 1300      Questionnaire   Patient Status  Not Applicable    Race  White or Caucasian    Location Patient Served At  [employer]    Insurance  Medicare    Uninsured  Not Applicable    Food  No food insecurities    Housing/Utilities  Yes, have permanent housing    Transportation  No transportation needs    Interpersonal Safety  Yes, feel physically and emotionally safe where you currently live    Medication  No medication insecurities    Medical Provider  Yes    Referrals  Not Applicable    ED Visit Averted  Not Applicable    Life-Saving Intervention Made  Not Applicable     05/25/18 Has Pac-Maker and Dr. Crista Elliot frequently. B/p 126/77 Pulse 73 .  [place] [facility] [phone]

## 2018-06-13 ENCOUNTER — Telehealth: Payer: Self-pay | Admitting: Cardiovascular Disease

## 2018-06-13 NOTE — Telephone Encounter (Signed)
Spoke with patient and she stated last week she was away from home and she became short of breath. She stated she went home and rested and subsided. She denied any shortness of breath but has had a headache since episode. She was also talking about knots in her stomach. Advised patient to follow up with PCP and will forward to Dr Royann Shivers for review.

## 2018-06-13 NOTE — Telephone Encounter (Signed)
Agree. Recent extensive cardiac workup was OK. No plan for additional cardiac testing at this time. MCr

## 2018-06-13 NOTE — Telephone Encounter (Signed)
New Message   Pt c/o Shortness Of Breath: STAT if SOB developed within the last 24 hours or pt is noticeably SOB on the phone  1. Are you currently SOB (can you hear that pt is SOB on the phone)? No, episode happened about a week ago  2. How long have you been experiencing SOB? It lasted for bout an hour  3. Are you SOB when sitting or when up moving around? Up moving around  4. Are you currently experiencing any other symptoms? Headache

## 2018-06-14 ENCOUNTER — Encounter: Payer: Medicare PPO | Admitting: Cardiovascular Disease

## 2018-07-17 ENCOUNTER — Encounter: Payer: Medicare PPO | Admitting: *Deleted

## 2018-07-18 ENCOUNTER — Telehealth: Payer: Self-pay

## 2018-07-18 NOTE — Telephone Encounter (Signed)
Left message for patient to remind of missed remote transmission.  

## 2018-07-28 ENCOUNTER — Encounter: Payer: Self-pay | Admitting: Cardiology

## 2018-08-23 ENCOUNTER — Telehealth: Payer: Self-pay

## 2018-08-23 NOTE — Telephone Encounter (Signed)
Left message to call back  

## 2018-08-30 ENCOUNTER — Encounter: Payer: Medicare PPO | Admitting: Cardiovascular Disease

## 2018-09-05 ENCOUNTER — Telehealth: Payer: Self-pay

## 2018-09-05 ENCOUNTER — Ambulatory Visit: Payer: Medicare PPO | Admitting: Cardiovascular Disease

## 2018-09-05 ENCOUNTER — Telehealth: Payer: Self-pay | Admitting: Cardiovascular Disease

## 2018-09-05 NOTE — Telephone Encounter (Signed)
I called the pt to help her send a manual transmission but did not get an answer. The pt mailbox is full.

## 2018-09-05 NOTE — Telephone Encounter (Signed)
Patient showed up at office for appointment with Dr.Croitoru today.Patient was told appointment was a phone visit.She was told we tried to call her several times and she did not answer.Unable to leave a message her mail box was full.Appointment rescheduled with Dr.Croitoru to 09/21/18 at 1:20 pm.Patient advised she needs to send a remote transmission.She stated she needs help she does not know how.Advised I will send message to device clinic for their help.

## 2018-09-05 NOTE — Telephone Encounter (Signed)
She was supposed to be a virtual visit

## 2018-09-05 NOTE — Telephone Encounter (Signed)
New Message:    Pt is on her way, she wanted to know where the office was.

## 2018-09-06 NOTE — Telephone Encounter (Signed)
Patient walked in office 4/28 and thought her appointment was at office.She was advised she missed her tele visit with Dr.Croitoru.Appointment rescheduled with Dr.Croitoru at office for a pacemaker check 09/21/18 at 1:20 pm.

## 2018-09-06 NOTE — Telephone Encounter (Signed)
This is the 2nd attempt to reach the pt about sending a manual transmission with her home monitor. Pt has a voicemail that is not set up.

## 2018-09-07 ENCOUNTER — Ambulatory Visit (INDEPENDENT_AMBULATORY_CARE_PROVIDER_SITE_OTHER): Payer: Medicare PPO | Admitting: *Deleted

## 2018-09-07 DIAGNOSIS — I441 Atrioventricular block, second degree: Secondary | ICD-10-CM | POA: Diagnosis not present

## 2018-09-07 DIAGNOSIS — R001 Bradycardia, unspecified: Secondary | ICD-10-CM

## 2018-09-07 LAB — CUP PACEART REMOTE DEVICE CHECK
Date Time Interrogation Session: 20200430145900
Implantable Lead Implant Date: 20190906
Implantable Lead Implant Date: 20190906
Implantable Lead Location: 753859
Implantable Lead Location: 753860
Implantable Pulse Generator Implant Date: 20190906
Pulse Gen Model: 2272
Pulse Gen Serial Number: 9061887

## 2018-09-07 NOTE — Telephone Encounter (Signed)
The pt did send her transmission 09/06/2018.

## 2018-09-07 NOTE — Telephone Encounter (Signed)
Added to schedule, will process as scheduled

## 2018-09-18 NOTE — Progress Notes (Signed)
Remote pacemaker transmission.   

## 2018-09-21 ENCOUNTER — Encounter: Payer: Self-pay | Admitting: Cardiovascular Disease

## 2018-09-21 ENCOUNTER — Other Ambulatory Visit: Payer: Self-pay

## 2018-09-21 ENCOUNTER — Ambulatory Visit (INDEPENDENT_AMBULATORY_CARE_PROVIDER_SITE_OTHER): Payer: Medicare PPO | Admitting: Cardiovascular Disease

## 2018-09-21 VITALS — BP 148/72 | HR 93 | Ht 63.0 in | Wt 197.0 lb

## 2018-09-21 DIAGNOSIS — Z950 Presence of cardiac pacemaker: Secondary | ICD-10-CM | POA: Diagnosis not present

## 2018-09-21 DIAGNOSIS — E669 Obesity, unspecified: Secondary | ICD-10-CM

## 2018-09-21 DIAGNOSIS — I441 Atrioventricular block, second degree: Secondary | ICD-10-CM | POA: Diagnosis not present

## 2018-09-21 DIAGNOSIS — R030 Elevated blood-pressure reading, without diagnosis of hypertension: Secondary | ICD-10-CM

## 2018-09-21 NOTE — Patient Instructions (Signed)
Medication Instructions:  Continue same medications   Lab work: None ordered   Testing/Procedures: None ordered  Follow-Up: At [facility], you and your health needs are our priority.  As part of our continuing mission to provide you with exceptional heart care, we have created designated Provider Care Teams.  These Care Teams include your primary Cardiologist (physician) and Advanced Practice Providers (APPs -  Physician Assistants and Nurse Practitioners) who all work together to provide you with the care you need, when you need it. . Follow up with Dr.Croitoru as needed . Schedule appointment with Primary Care Dr
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
## 2018-09-23 ENCOUNTER — Encounter: Payer: Self-pay | Admitting: Cardiovascular Disease

## 2018-09-23 DIAGNOSIS — R030 Elevated blood-pressure reading, without diagnosis of hypertension: Secondary | ICD-10-CM | POA: Insufficient documentation

## 2018-09-23 DIAGNOSIS — E669 Obesity, unspecified: Secondary | ICD-10-CM | POA: Insufficient documentation

## 2018-09-23 DIAGNOSIS — Z950 Presence of cardiac pacemaker: Secondary | ICD-10-CM | POA: Insufficient documentation

## 2018-12-11 ENCOUNTER — Telehealth: Payer: Self-pay | Admitting: Internal Medicine

## 2018-12-11 ENCOUNTER — Encounter (HOSPITAL_COMMUNITY): Payer: Self-pay | Admitting: Emergency Medicine

## 2018-12-11 ENCOUNTER — Other Ambulatory Visit: Payer: Self-pay

## 2018-12-11 ENCOUNTER — Emergency Department (HOSPITAL_COMMUNITY)
Admission: EM | Admit: 2018-12-11 | Discharge: 2018-12-11 | Disposition: A | Discharge status: Home / Self Care | Payer: Medicare PPO | Attending: Emergency Medicine | Admitting: Emergency Medicine

## 2018-12-11 ENCOUNTER — Emergency Department (HOSPITAL_COMMUNITY): Payer: Medicare PPO

## 2018-12-11 DIAGNOSIS — R0789 Other chest pain: Secondary | ICD-10-CM | POA: Diagnosis present

## 2018-12-11 DIAGNOSIS — Z5321 Procedure and treatment not carried out due to patient leaving prior to being seen by health care provider: Secondary | ICD-10-CM | POA: Diagnosis not present

## 2018-12-11 LAB — CBC
HCT: 43 % (ref 36.0–46.0)
Hemoglobin: 13.3 g/dL (ref 12.0–15.0)
MCH: 30.6 pg (ref 26.0–34.0)
MCHC: 30.9 g/dL (ref 30.0–36.0)
MCV: 99.1 fL (ref 80.0–100.0)
Platelets: 218 10*3/uL (ref 150–400)
RBC: 4.34 MIL/uL (ref 3.87–5.11)
RDW: 12.3 % (ref 11.5–15.5)
WBC: 8.2 10*3/uL (ref 4.0–10.5)
nRBC: 0 % (ref 0.0–0.2)

## 2018-12-11 LAB — TROPONIN I (HIGH SENSITIVITY): Troponin I (High Sensitivity): 5 ng/L (ref <18)

## 2018-12-11 LAB — BASIC METABOLIC PANEL
Anion gap: 8 (ref 5–15)
BUN: 15 mg/dL (ref 8–23)
CO2: 25 mmol/L (ref 22–32)
Calcium: 9.1 mg/dL (ref 8.9–10.3)
Chloride: 106 mmol/L (ref 98–111)
Creatinine, Ser: 0.87 mg/dL (ref 0.44–1.00)
GFR calc Af Amer: >60 mL/min (ref >60)
GFR calc non Af Amer: >60 mL/min (ref >60)
Glucose, Bld: 88 mg/dL (ref 70–99)
Potassium: 3.9 mmol/L (ref 3.5–5.1)
Sodium: 139 mmol/L (ref 135–145)

## 2018-12-11 MED ORDER — SODIUM CHLORIDE 0.9% FLUSH: 3.0000 mL | Freq: Once | INTRAVENOUS | Status: DC

## 2018-12-11 NOTE — Telephone Encounter (Signed)
Pt reports having headache earlier today along with some SOB and pain in the center of her chest.Vey vague with onset, states she drive up here alone, lives a lone with a cat,. I escorted her to the ED for further evaluation.

## 2018-12-11 NOTE — Telephone Encounter (Signed)
Pt came into the office stating she's having some pain around her pacemaker and states she's having some funny feelings in her chest.   Will have triage nurse come out and speak w/ patient at this time.

## 2018-12-11 NOTE — ED Triage Notes (Signed)
Pt sent over by heart care because patient was c/o CP. Pt states she is not having any pain right now, but it comes and goes. Pt had pacemaker placed a year ago. Pt also c/o HA.

## 2018-12-11 NOTE — Telephone Encounter (Signed)
Pt currently in [facility] ED for chest pain. Encounter closed.

## 2018-12-18 ENCOUNTER — Telehealth: Payer: Self-pay | Admitting: *Deleted

## 2018-12-18 NOTE — Telephone Encounter (Signed)
The patient came in as a walk in today. She stated that she had a dentist procedure last week and developed an allergy to something they used. She felt like she had a lump in her throat and her tongue swelled up. She has been advised to reach out to her dentist to see what they used so that they can keep a record of what she might be allergic to. She was currently feeling better and had no symptoms.

## 2018-12-19 ENCOUNTER — Telehealth: Payer: Self-pay | Admitting: *Deleted

## 2018-12-19 NOTE — Telephone Encounter (Signed)
The patient came back to the office to see Dr. Sallyanne Kuster about her dental visit and the possible allergic reaction. She had been advised to reach out to her dentist and PCP. She was not currently having any symptoms.   She has also been made aware that she was supposed to follow up with Dr. Sallyanne Kuster as needed. Her cardiologist is Dr. Lovena Le and he will also manage the pacemaker. She has verbalized her understanding.

## 2018-12-21 ENCOUNTER — Ambulatory Visit (INDEPENDENT_AMBULATORY_CARE_PROVIDER_SITE_OTHER): Payer: Medicare PPO | Admitting: *Deleted

## 2018-12-21 DIAGNOSIS — I441 Atrioventricular block, second degree: Secondary | ICD-10-CM | POA: Diagnosis not present

## 2018-12-22 ENCOUNTER — Telehealth: Payer: Self-pay | Admitting: *Deleted

## 2018-12-22 LAB — CUP PACEART REMOTE DEVICE CHECK
Battery Remaining Longevity: 116 mo
Battery Remaining Percentage: 95.5 %
Battery Voltage: 3.02 V
Brady Statistic AP VP Percent: 4.2 %
Brady Statistic AP VS Percent: 1 %
Brady Statistic AS VP Percent: 95 %
Brady Statistic AS VS Percent: 1 %
Brady Statistic RA Percent Paced: 4.2 %
Brady Statistic RV Percent Paced: 99 %
Date Time Interrogation Session: 20200813060032
Implantable Lead Implant Date: 20190906
Implantable Lead Implant Date: 20190906
Implantable Lead Location: 753859
Implantable Lead Location: 753860
Implantable Pulse Generator Implant Date: 20190906
Lead Channel Impedance Value: 540 Ohm
Lead Channel Impedance Value: 630 Ohm
Lead Channel Pacing Threshold Amplitude: 0.75 V
Lead Channel Pacing Threshold Amplitude: 1.75 V
Lead Channel Pacing Threshold Pulse Width: 0.5 ms
Lead Channel Pacing Threshold Pulse Width: 0.5 ms
Lead Channel Sensing Intrinsic Amplitude: 10.8 mV
Lead Channel Sensing Intrinsic Amplitude: 2.2 mV
Lead Channel Setting Pacing Amplitude: 1 V
Lead Channel Setting Pacing Amplitude: 3.25 V
Lead Channel Setting Pacing Pulse Width: 0.5 ms
Lead Channel Setting Sensing Sensitivity: 2.5 mV
Pulse Gen Model: 2272
Pulse Gen Serial Number: 9061887

## 2018-12-22 NOTE — Telephone Encounter (Signed)
Patient stopped by office - stated that she swallowed a tooth that fell out last evening.  No c/o any sob, dizziness, or chest pain.  No difficulty breathing either.  Stated that she wanted to make sure this would not affect her pacemaker.  Stated she did not get to do her remote check yesterday.  Stated that she is due to see the dentist on Monday. Please contact patient regarding the remote check.

## 2018-12-27 NOTE — Telephone Encounter (Signed)
LMOVM (DPR) advising that transmission received on 12/21/18 shows normal device function and that swallowing a tooth will not affect PPM function. [place] phone number for any additional questions/concerns.

## 2019-01-01 ENCOUNTER — Encounter: Payer: Self-pay | Admitting: Cardiology

## 2019-01-01 NOTE — Progress Notes (Signed)
Remote pacemaker transmission.   

## 2019-01-23 ENCOUNTER — Encounter: Payer: Medicare PPO | Admitting: Internal Medicine

## 2019-02-13 ENCOUNTER — Encounter: Payer: Medicare PPO | Admitting: Internal Medicine

## 2019-03-22 ENCOUNTER — Ambulatory Visit (INDEPENDENT_AMBULATORY_CARE_PROVIDER_SITE_OTHER): Payer: Medicare PPO | Admitting: *Deleted

## 2019-03-22 DIAGNOSIS — I441 Atrioventricular block, second degree: Secondary | ICD-10-CM

## 2019-03-22 LAB — CUP PACEART REMOTE DEVICE CHECK
Battery Remaining Longevity: 125 mo
Battery Remaining Percentage: 95.5 %
Battery Voltage: 3.02 V
Brady Statistic AP VP Percent: 4.9 %
Brady Statistic AP VS Percent: 1 %
Brady Statistic AS VP Percent: 95 %
Brady Statistic AS VS Percent: 1 %
Brady Statistic RA Percent Paced: 4.8 %
Brady Statistic RV Percent Paced: 99 %
Date Time Interrogation Session: 20201112070014
Implantable Lead Implant Date: 20190906
Implantable Lead Implant Date: 20190906
Implantable Lead Location: 753859
Implantable Lead Location: 753860
Implantable Pulse Generator Implant Date: 20190906
Lead Channel Impedance Value: 510 Ohm
Lead Channel Impedance Value: 560 Ohm
Lead Channel Pacing Threshold Amplitude: 0.75 V
Lead Channel Pacing Threshold Amplitude: 1.625 V
Lead Channel Pacing Threshold Pulse Width: 0.5 ms
Lead Channel Pacing Threshold Pulse Width: 0.5 ms
Lead Channel Sensing Intrinsic Amplitude: 10.8 mV
Lead Channel Sensing Intrinsic Amplitude: 2.6 mV
Lead Channel Setting Pacing Amplitude: 1 V
Lead Channel Setting Pacing Amplitude: 3.125
Lead Channel Setting Pacing Pulse Width: 0.5 ms
Lead Channel Setting Sensing Sensitivity: 2.5 mV
Pulse Gen Model: 2272
Pulse Gen Serial Number: 9061887

## 2019-04-13 NOTE — Progress Notes (Signed)
Remote pacemaker transmission.   

## 2019-04-24 ENCOUNTER — Other Ambulatory Visit: Payer: Self-pay

## 2019-04-24 ENCOUNTER — Encounter: Payer: Self-pay | Admitting: Internal Medicine

## 2019-04-24 ENCOUNTER — Ambulatory Visit: Payer: Medicare PPO | Admitting: Internal Medicine

## 2019-04-24 VITALS — BP 154/81 | HR 84 | Temp 97.5°F | Wt 189.0 lb

## 2019-04-24 DIAGNOSIS — I441 Atrioventricular block, second degree: Secondary | ICD-10-CM

## 2019-04-24 DIAGNOSIS — Z950 Presence of cardiac pacemaker: Secondary | ICD-10-CM | POA: Diagnosis not present

## 2019-04-24 DIAGNOSIS — E669 Obesity, unspecified: Secondary | ICD-10-CM

## 2019-04-24 NOTE — Progress Notes (Signed)
HPI Mrs. Penalver returns today for followup. He is a pleasant 78 yo woman with a h/o HTN and heart block s/p PPM insertion. In the interim, she has done well with no chest pain. No edema. She has fleeting non-exertional chest pressure.  Allergies  Allergen Reactions  . Penicillins Hives     Current Outpatient Medications  Medication Sig Dispense Refill  . Cholecalciferol (VITAMIN D3) 1000 units CAPS Take 5,000 Units by mouth daily.     . Coenzyme Q10 (CO Q 10) 10 MG CAPS Take by mouth.    . cyanocobalamin 1000 MCG tablet Take 5,000 mcg by mouth daily.     Marland Kitchen ketoconazole (NIZORAL) 2 % cream Apply 1 application topically daily. 30 g 1  . polyethylene glycol powder (GLYCOLAX/MIRALAX) powder Take 17 g by mouth daily as needed for moderate constipation. 225 g 1   No current facility-administered medications for this visit.     Past Medical History:  Diagnosis Date  . H/O cardiovascular stress test    Normal in 2018, 09/2017  . 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 Presence of permanent cardiac pacemaker 01/13/2018  . Second degree AV block, Mobitz type II    a. s/p St. Jude PPM 01/2018.  . Stroke [facility]) 2018   Diagnosed by her cousin, who is a Marine scientist. Did not see a doctor. Sx resolved.   . Symptomatic bradycardia     ROS:   All systems reviewed and negative except as noted in the HPI.   Past Surgical History:  Procedure Laterality Date  . ABDOMINAL HYSTERECTOMY    . CATARACT EXTRACTION W/ INTRAOCULAR LENS  IMPLANT, BILATERAL Bilateral   . INSERT / REPLACE / REMOVE PACEMAKER  01/13/2018  . PACEMAKER IMPLANT N/A 01/13/2018   Procedure: PACEMAKER IMPLANT;  Surgeon: Evans Lance, MD;  Location: [facility];  Service: Cardiovascular;  Laterality: N/A;     Family History  Problem Relation Age of Onset  . Pulmonary fibrosis Mother   . Heart disease Father   . Alcohol abuse Father   . Cancer Paternal Uncle        skin      Social History   Socioeconomic History  . Marital status: Divorced    Spouse name: Not on file  . Number of children: Not on file  . Years of education: Not on file  . Highest education level: Not on file  Occupational History  . Not on file  Tobacco Use  . Smoking status: Never Smoker  . Smokeless tobacco: Never Used  Substance and Sexual Activity  . Alcohol use: No  . Drug use: No  . Sexual activity: Not Currently  Other Topics Concern  . Not on file  Social History Narrative  . Not on file   Social Determinants of Health   Financial Resource Strain:   . Difficulty of Paying Living Expenses: Not on file  Food Insecurity:   . Worried About Charity fundraiser in the Last Year: Not on file  . Ran Out of Food in the Last Year: Not on file  Transportation Needs:   . Lack of Transportation (Medical): Not on file  . Lack of Transportation (Non-Medical): Not on file  Physical Activity:   . Days of Exercise per Week: Not on file  . Minutes of Exercise per Session: Not on file  Stress:   . Feeling of Stress : Not on file  Social  Connections:   . Frequency of Communication with Friends and Family: Not on file  . Frequency of Social Gatherings with Friends and Family: Not on file  . Attends Religious Services: Not on file  . Active Member of Clubs or Organizations: Not on file  . Attends Banker Meetings: Not on file  . Marital Status: Not on file  Intimate Partner Violence:   . Fear of Current or Ex-Partner: Not on file  . Emotionally Abused: Not on file  . Physically Abused: Not on file  . Sexually Abused: Not on file     BP (!) 154/81   Pulse 84   Temp (!) 97.5 F (36.4 C)   Wt 189 lb (85.7 kg)   SpO2 97%   BMI 33.48 kg/m   Physical Exam:  Well appearing NAD HEENT: Unremarkable Neck:  No JVD, no thyromegally Lymphatics:  No adenopathy Back:  No CVA tenderness Lungs:  Clear HEART:  Regular rate rhythm, no murmurs, no rubs, no  clicks Abd:  soft, positive bowel sounds, no organomegally, no rebound, no guarding Ext:  2 plus pulses, no edema, no cyanosis, no clubbing Skin:  No rashes no nodules Neuro:  CN II through XII intact, motor grossly intact  DEVICE  Normal device function.  See PaceArt for details.   Assess/Plan: 1. CHB - she is s/p PPM and is asymptomatic. 2. PPM - her St. Jude DDD PM is working normally.  3. HTN - her bp is up a bit today. She is encouraged to lose weight and avoid salty foods.  4. Obesity - she is encouraged to lose weight. She admits to dietary indiscretion.  Holly Chang.D.

## 2019-04-24 NOTE — Patient Instructions (Signed)
Medication Instructions:  Your physician recommends that you continue on your current medications as directed. Please refer to the Current Medication list given to you today.  *If you need a refill on your cardiac medications before your next appointment, please call your pharmacy*  Lab Work: NONE  If you have labs (blood work) drawn today and your tests are completely normal, you will receive your results only by: . MyChart Message (if you have MyChart) OR . A paper copy in the mail If you have any lab test that is abnormal or we need to change your treatment, we will call you to review the results.  Testing/Procedures: NONE   Follow-Up: At [facility], you and your health needs are our priority.  As part of our continuing mission to provide you with exceptional heart care, we have created designated Provider Care Teams.  These Care Teams include your primary Cardiologist (physician) and Advanced Practice Providers (APPs -  Physician Assistants and Nurse Practitioners) who all work together to provide you with the care you need, when you need it.  Your next appointment:   1 year(s)  The format for your next appointment:   In Person  Provider:   Gregg Taylor, MD  Other Instructions Thank you for choosing [facility]!    

## 2019-05-15 ENCOUNTER — Encounter: Payer: Medicare PPO | Admitting: Internal Medicine

## 2019-06-21 ENCOUNTER — Ambulatory Visit (INDEPENDENT_AMBULATORY_CARE_PROVIDER_SITE_OTHER): Payer: Medicare Other | Admitting: *Deleted

## 2019-06-21 DIAGNOSIS — I441 Atrioventricular block, second degree: Secondary | ICD-10-CM | POA: Diagnosis not present

## 2019-06-21 LAB — CUP PACEART REMOTE DEVICE CHECK
Battery Remaining Longevity: 98 mo
Battery Remaining Percentage: 95.5 %
Battery Voltage: 3.02 V
Brady Statistic AP VP Percent: 5.1 %
Brady Statistic AP VS Percent: 1 %
Brady Statistic AS VP Percent: 95 %
Brady Statistic AS VS Percent: 1 %
Brady Statistic RA Percent Paced: 5.1 %
Brady Statistic RV Percent Paced: 99 %
Date Time Interrogation Session: 20210211020023
Implantable Lead Implant Date: 20190906
Implantable Lead Implant Date: 20190906
Implantable Lead Location: 753859
Implantable Lead Location: 753860
Implantable Pulse Generator Implant Date: 20190906
Lead Channel Impedance Value: 530 Ohm
Lead Channel Impedance Value: 600 Ohm
Lead Channel Pacing Threshold Amplitude: 0.75 V
Lead Channel Pacing Threshold Amplitude: 1.375 V
Lead Channel Pacing Threshold Pulse Width: 0.5 ms
Lead Channel Pacing Threshold Pulse Width: 0.5 ms
Lead Channel Sensing Intrinsic Amplitude: 10.8 mV
Lead Channel Sensing Intrinsic Amplitude: 2.6 mV
Lead Channel Setting Pacing Amplitude: 2.375
Lead Channel Setting Pacing Amplitude: 5 V
Lead Channel Setting Pacing Pulse Width: 0.5 ms
Lead Channel Setting Sensing Sensitivity: 2.5 mV
Pulse Gen Model: 2272
Pulse Gen Serial Number: 9061887

## 2019-06-21 NOTE — Progress Notes (Signed)
PPM Remote  

## 2019-07-13 IMAGING — DX DG CHEST 1V PORT
1 series · 1 of 1 positions shown · non-contrast
Comparison: 01/13/2018.

CLINICAL DATA: Dyspnea.

EXAM:
PORTABLE CHEST 1 VIEW

[chest]
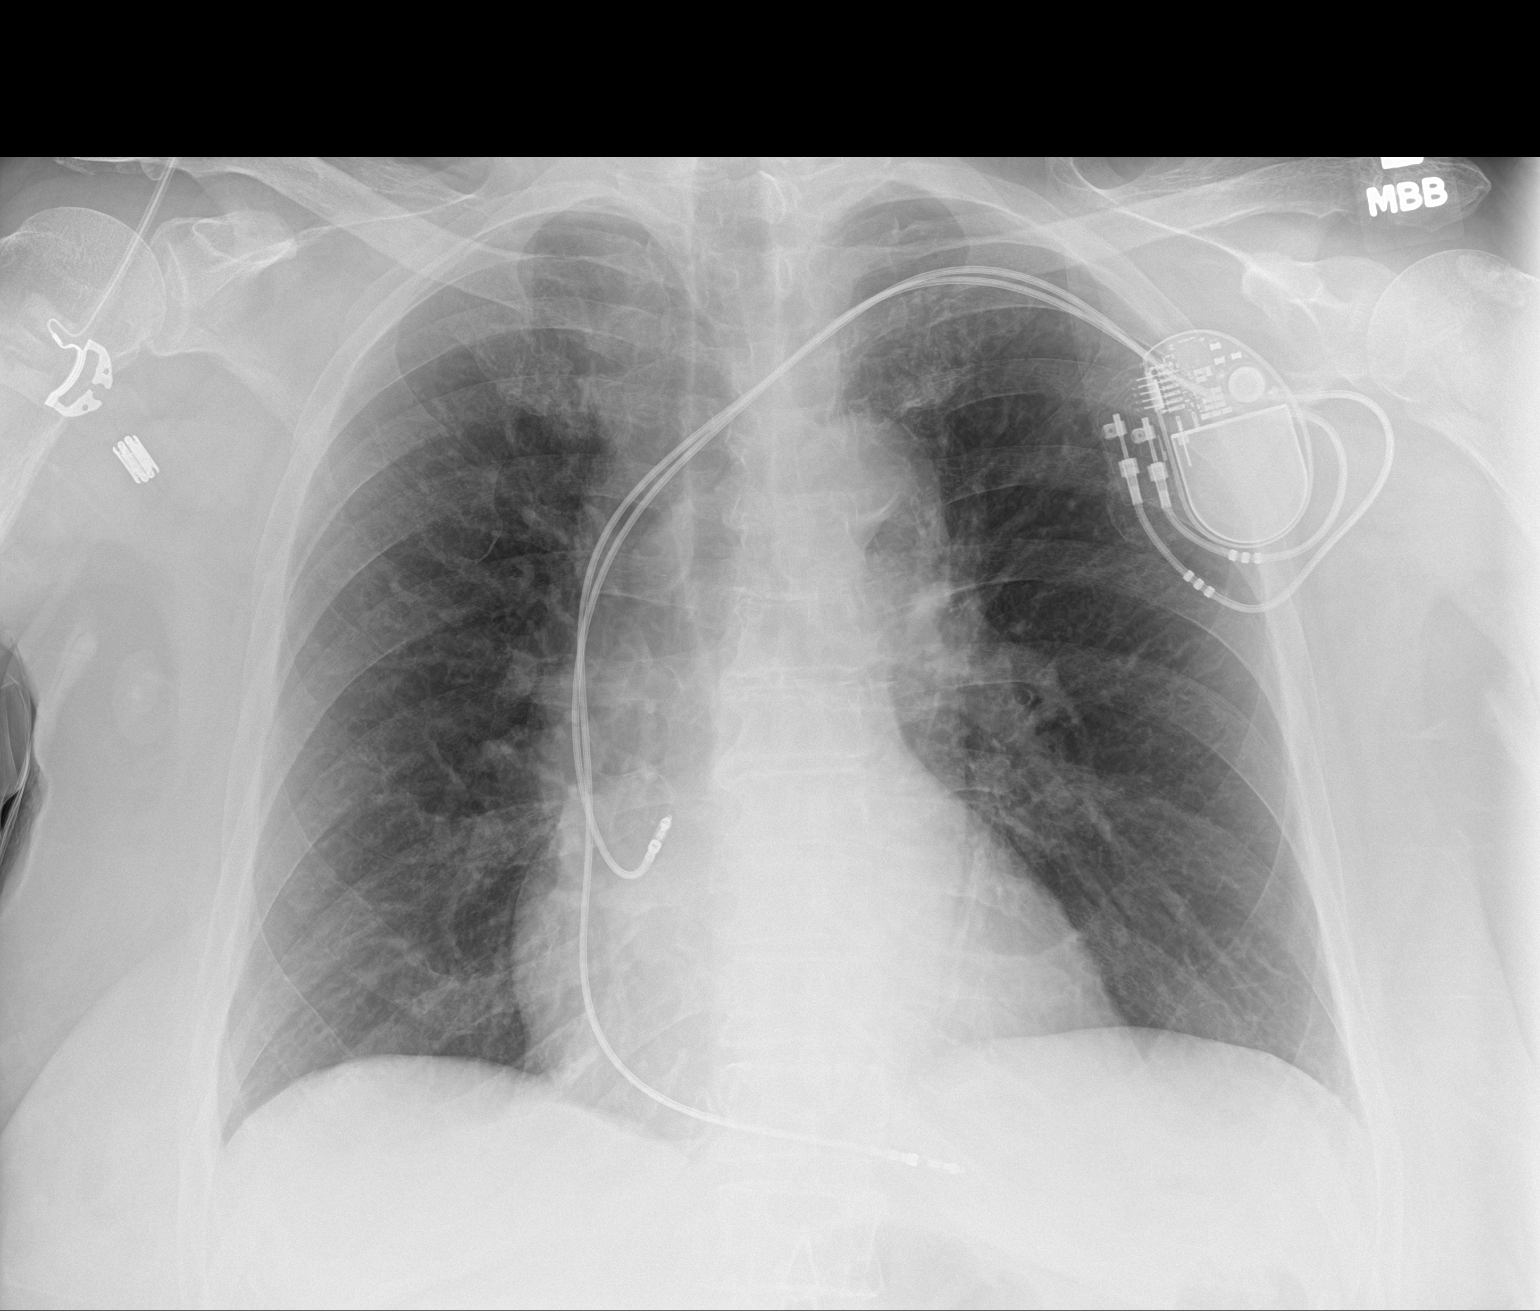

[1 of 1 positions shown; findings below may reference images not displayed]

FINDINGS: Normal sized heart. Tortuous and pallor sleep calcified thoracic
aorta. Interval left subclavian bipolar pacemaker with satisfactory
position of the leads. Clear lungs with normal vascularity. Thoracic
spine degenerative changes. Diffuse osteopenia.
IMPRESSION: no acute abnormality.

## 2019-08-31 ENCOUNTER — Ambulatory Visit (INDEPENDENT_AMBULATORY_CARE_PROVIDER_SITE_OTHER): Payer: Medicare Other | Admitting: Family Medicine

## 2019-08-31 ENCOUNTER — Encounter: Payer: Self-pay | Admitting: Family Medicine

## 2019-08-31 ENCOUNTER — Other Ambulatory Visit: Payer: Self-pay

## 2019-08-31 VITALS — BP 136/74 | HR 71 | Temp 98.2°F | Ht 63.0 in | Wt 193.2 lb

## 2019-08-31 DIAGNOSIS — F22 Delusional disorders: Secondary | ICD-10-CM | POA: Diagnosis not present

## 2019-08-31 DIAGNOSIS — F0151 Vascular dementia with behavioral disturbance: Secondary | ICD-10-CM

## 2019-08-31 DIAGNOSIS — Z950 Presence of cardiac pacemaker: Secondary | ICD-10-CM

## 2019-08-31 DIAGNOSIS — I441 Atrioventricular block, second degree: Secondary | ICD-10-CM | POA: Diagnosis not present

## 2019-08-31 DIAGNOSIS — F0152 Vascular dementia, unspecified severity, with psychotic disturbance: Secondary | ICD-10-CM
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
## 2019-09-02 NOTE — Progress Notes (Signed)
Thanks! Her pacemaker is being checked every 3 months (most recently June 21, 2019) remotely through our device clinic. She may have forgotten this, but we have been receiving her transmissions regularly (followed by Dr.  Lewayne Bunting).

## 2019-09-05 ENCOUNTER — Ambulatory Visit: Payer: Medicare Other | Attending: Internal Medicine

## 2019-09-05 DIAGNOSIS — Z23 Encounter for immunization: Secondary | ICD-10-CM

## 2019-09-05 NOTE — Progress Notes (Signed)
   Covid-19 Vaccination Clinic  Name:  Holly Chang    MRN: [id number] DOB: April 30, 1941  09/05/2019  Holly Chang was observed post Covid-19 immunization for 15 minutes without incident. She was provided with Vaccine Information Sheet and instruction to access the V-Safe system.   Holly Chang was instructed to call 911 with any severe reactions post vaccine: Marland Kitchen Difficulty breathing  . Swelling of face and throat  . A fast heartbeat  . A bad rash all over body  . Dizziness and weakness   Immunizations Administered    Name Date Dose VIS Date Route   Moderna COVID-19 Vaccine 09/05/2019  1:48 PM 0.5 mL 04/2019 Intramuscular   Manufacturer: Moderna   Lot: [id number]   NDC: 38887-579-72

## 2019-09-20 ENCOUNTER — Ambulatory Visit (INDEPENDENT_AMBULATORY_CARE_PROVIDER_SITE_OTHER): Payer: Medicare Other | Admitting: *Deleted

## 2019-09-20 ENCOUNTER — Telehealth: Payer: Self-pay

## 2019-09-20 DIAGNOSIS — I441 Atrioventricular block, second degree: Secondary | ICD-10-CM | POA: Diagnosis not present

## 2019-09-20 LAB — CUP PACEART REMOTE DEVICE CHECK
Battery Remaining Longevity: 93 mo
Battery Remaining Percentage: 95.5 %
Battery Voltage: 3.01 V
Brady Statistic AP VP Percent: 5.1 %
Brady Statistic AP VS Percent: 1 %
Brady Statistic AS VP Percent: 95 %
Brady Statistic AS VS Percent: 1 %
Brady Statistic RA Percent Paced: 5.1 %
Brady Statistic RV Percent Paced: 99 %
Date Time Interrogation Session: 20210513020015
Implantable Lead Implant Date: 20190906
Implantable Lead Implant Date: 20190906
Implantable Lead Location: 753859
Implantable Lead Location: 753860
Implantable Pulse Generator Implant Date: 20190906
Lead Channel Impedance Value: 490 Ohm
Lead Channel Impedance Value: 530 Ohm
Lead Channel Pacing Threshold Amplitude: 0.5 V
Lead Channel Pacing Threshold Amplitude: 0.75 V
Lead Channel Pacing Threshold Pulse Width: 0.5 ms
Lead Channel Pacing Threshold Pulse Width: 0.5 ms
Lead Channel Sensing Intrinsic Amplitude: 10.8 mV
Lead Channel Sensing Intrinsic Amplitude: 2.6 mV
Lead Channel Setting Pacing Amplitude: 1.5 V
Lead Channel Setting Pacing Amplitude: 5 V
Lead Channel Setting Pacing Pulse Width: 0.5 ms
Lead Channel Setting Sensing Sensitivity: 2.5 mV
Pulse Gen Model: 2272
Pulse Gen Serial Number: 9061887

## 2019-09-20 NOTE — Telephone Encounter (Signed)
Merlin alert received, PM setting for Ventricular auto cap are in high out put mode.  Reviewed with device rep, although decvice appears to be functioning appropriately we need pt to come into office for in person device check to evaluate leads and adjust settings in order to optimize device performance and prevent unnecessary battery drain.    Left message for patient requesting she call back to discuss and schedule appt for next week.

## 2019-09-24 NOTE — Progress Notes (Signed)
Remote pacemaker transmission.   

## 2019-09-26 NOTE — Telephone Encounter (Signed)
Called patient regarding in need of in-clinic apt. No answer, LMOVM.

## 2019-10-02 NOTE — Telephone Encounter (Signed)
Attempted to reach patient, again no answer.    DPR on file, ok to speak with Chip Boer.  Attempted to reach [place], no answer, LVM on identified VM requesting she have patient call us back.

## 2019-10-03 ENCOUNTER — Ambulatory Visit: Payer: Medicare Other | Attending: Internal Medicine

## 2019-10-03 DIAGNOSIS — Z23 Encounter for immunization: Secondary | ICD-10-CM

## 2019-10-03 NOTE — Progress Notes (Signed)
   Covid-19 Vaccination Clinic  Name:  Holly Chang    MRN: [id number] DOB: [birth year]  10/03/2019  [name] was observed post Covid-19 immunization for 15 minutes without incident. She was provided with Vaccine Information Sheet and instruction to access the V-Safe system.   [name] was instructed to call 911 with any severe reactions post vaccine: Marland Kitchen Difficulty breathing  . Swelling of face and throat  . A fast heartbeat  . A bad rash all over body  . Dizziness and weakness   Immunizations Administered    Name Date Dose VIS Date Route   Moderna COVID-19 Vaccine 10/03/2019 10:50 AM 0.5 mL 04/2019 Intramuscular   Manufacturer: Gala Murdoch   Lot: [id number]   NDC: [id number]

## 2019-10-03 NOTE — Telephone Encounter (Signed)
Called patient, no answer. LMOVM.

## 2019-10-04 NOTE — Telephone Encounter (Signed)
The pt cousin Chip Boer returning the nurses call. I let her talk with the device nurse Leigh.

## 2019-10-11 ENCOUNTER — Other Ambulatory Visit: Payer: Self-pay

## 2019-10-11 ENCOUNTER — Ambulatory Visit (INDEPENDENT_AMBULATORY_CARE_PROVIDER_SITE_OTHER): Payer: Medicare Other | Admitting: Emergency Medicine

## 2019-10-11 DIAGNOSIS — I441 Atrioventricular block, second degree: Secondary | ICD-10-CM | POA: Diagnosis not present

## 2019-10-11 DIAGNOSIS — Z950 Presence of cardiac pacemaker: Secondary | ICD-10-CM | POA: Diagnosis not present

## 2019-10-11 NOTE — Patient Instructions (Signed)
Your physician wants you to follow-up in: December 2021 with Dr. Ladona Ridgel in [place]. You will receive a reminder letter in the mail two months in advance. If you don't receive a letter, please call our office to schedule the follow-up appointment.

## 2019-10-15 LAB — CUP PACEART INCLINIC DEVICE CHECK
Battery Remaining Longevity: 117 mo
Battery Voltage: 3.02 V
Brady Statistic RA Percent Paced: 5.4 %
Brady Statistic RV Percent Paced: 99.96 %
Date Time Interrogation Session: 20210603161800
Implantable Lead Implant Date: 20190906
Implantable Lead Implant Date: 20190906
Implantable Lead Location: 753859
Implantable Lead Location: 753860
Implantable Pulse Generator Implant Date: 20190906
Lead Channel Impedance Value: 525 Ohm
Lead Channel Impedance Value: 575 Ohm
Lead Channel Pacing Threshold Amplitude: 0.5 V
Lead Channel Pacing Threshold Amplitude: 0.875 V
Lead Channel Pacing Threshold Pulse Width: 0.5 ms
Lead Channel Pacing Threshold Pulse Width: 0.5 ms
Lead Channel Sensing Intrinsic Amplitude: 10.8 mV
Lead Channel Sensing Intrinsic Amplitude: 2.6 mV
Lead Channel Setting Pacing Amplitude: 1.125
Lead Channel Setting Pacing Amplitude: 1.625
Lead Channel Setting Pacing Pulse Width: 0.5 ms
Lead Channel Setting Sensing Sensitivity: 2.5 mV
Pulse Gen Model: 2272
Pulse Gen Serial Number: 9061887

## 2019-10-15 NOTE — Progress Notes (Signed)
Pacemaker check in clinic, added-on for potential RV output reprogramming. Normal device function. Thresholds, sensing, impedances consistent with previous measurements. RV auto capture trend overall stable, threshold 0.875V @ 0.77ms, template updated today. Device programmed to maximize longevity. 72 mode switches (<1%)--AT, longest 32sec. No high ventricular rates noted. Device programmed at appropriate safety margins. Histogram distribution slightly right-shifted, patient asymptomatic. Estimated longevity 7.6-9.8 years. Patient enrolled in remote follow-up. Patient education completed. Merlin on 12/20/19 and ROV with Dr. Ladona Ridgel in [place] in 04/2020 per recall.

## 2019-11-26 ENCOUNTER — Ambulatory Visit (INDEPENDENT_AMBULATORY_CARE_PROVIDER_SITE_OTHER): Payer: Medicare Other | Admitting: Nurse Practitioner

## 2019-11-26 ENCOUNTER — Other Ambulatory Visit: Payer: Self-pay

## 2019-11-26 ENCOUNTER — Encounter: Payer: Self-pay | Admitting: Nurse Practitioner

## 2019-11-26 VITALS — BP 132/79 | HR 65 | Temp 97.9°F | Ht 63.0 in | Wt 195.2 lb

## 2019-11-26 DIAGNOSIS — B372 Candidiasis of skin and nail: Secondary | ICD-10-CM | POA: Diagnosis not present

## 2019-11-26 DIAGNOSIS — R600 Localized edema: Secondary | ICD-10-CM | POA: Insufficient documentation

## 2019-11-26 DIAGNOSIS — R609 Edema, unspecified: Secondary | ICD-10-CM | POA: Diagnosis not present

## 2019-11-26 MED ORDER — MICONAZOLE NITRATE 2 % EX POWD: CUTANEOUS | 0 refills | Status: DC | PRN

## 2019-11-26 MED ORDER — KETOCONAZOLE 2 % EX CREA: 1.0000 "application " | TOPICAL_CREAM | Freq: Every day | CUTANEOUS | 0 refills | Status: DC

## 2019-11-26 NOTE — Progress Notes (Signed)
Acute Office Visit  Subjective:    Patient ID: Holly Chang, female    DOB: [birth year], 79 y.o.   MRN: [id number]  Chief Complaint  Patient presents with  . Rash    Rash This is a new problem. The current episode started in the past 7 days. The problem has been gradually worsening since onset. Location: Underneath bilateral breast. The rash is characterized by redness and peeling (moist). She was exposed to nothing. Past treatments include nothing.   Patient is in today for bilateral lower extremity edema.  Patient reports that this is new in the last 7 days.  Patient is reporting tenderness around ankles when touched.  Patient is not reporting any fever, weakness or fatigue.  Patient has a pacemaker, patient is not reporting any shortness of breath.     Past Medical History:  Diagnosis Date  . H/O cardiovascular stress test    Normal in 2018, 09/2017  . 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 Presence of permanent cardiac pacemaker 01/13/2018  . Second degree AV block, Mobitz type II    a. s/p St. Jude PPM 01/2018.  . Stroke [facility]) 2018   Diagnosed by her cousin, who is a Engineer, civil (consulting). Did not see a doctor. Sx resolved.   . Symptomatic bradycardia     Past Surgical History:  Procedure Laterality Date  . ABDOMINAL HYSTERECTOMY    . CATARACT EXTRACTION W/ INTRAOCULAR LENS  IMPLANT, BILATERAL Bilateral   . INSERT / REPLACE / REMOVE PACEMAKER  01/13/2018  . PACEMAKER IMPLANT N/A 01/13/2018   Procedure: PACEMAKER IMPLANT;  Surgeon: Marinus Maw, MD;  Location: [facility] INVASIVE CV LAB;  Service: Cardiovascular;  Laterality: N/A;    Family History  Problem Relation Age of Onset  . Pulmonary fibrosis Mother   . Heart disease Father   . Alcohol abuse Father   . Cancer Paternal Uncle        skin    Social History   Socioeconomic History  . Marital status: Divorced    Spouse name: Not on file  . Number of children: Not on file  . Years of  education: Not on file  . Highest education level: Not on file  Occupational History  . Not on file  Tobacco Use  . Smoking status: Never Smoker  . Smokeless tobacco: Never Used  Vaping Use  . Vaping Use: Never used  Substance and Sexual Activity  . Alcohol use: No  . Drug use: No  . Sexual activity: Not Currently  Other Topics Concern  . Not on file  Social History Narrative  . Not on file   Social Determinants of Health   Financial Resource Strain:   . Difficulty of Paying Living Expenses:   Food Insecurity:   . Worried About Programme researcher, broadcasting/film/video in the Last Year:   . Barista in the Last Year:   Transportation Needs:   . Freight forwarder (Medical):   Marland Kitchen Lack of Transportation (Non-Medical):   Physical Activity:   . Days of Exercise per Week:   . Minutes of Exercise per Session:   Stress:   . Feeling of Stress :   Social Connections:   . Frequency of Communication with Friends and Family:   . Frequency of Social Gatherings with Friends and Family:   . Attends Religious Services:   . Active Member of Clubs or Organizations:   . Attends Banker Meetings:   .  Marital Status:   Intimate Partner Violence:   . Fear of Current or Ex-Partner:   . Emotionally Abused:   Marland Kitchen Physically Abused:   . Sexually Abused:     No outpatient medications prior to visit.   No facility-administered medications prior to visit.    Allergies  Allergen Reactions  . Penicillins Hives    Review of Systems  Constitutional: Negative.   HENT: Negative.   Eyes: Negative.   Respiratory: Negative.   Cardiovascular: Positive for leg swelling. Negative for chest pain and palpitations.  Gastrointestinal: Negative.   Genitourinary: Negative.   Musculoskeletal: Negative.   Skin: Positive for color change and rash.  Psychiatric/Behavioral: Negative.        Objective:    Physical Exam Constitutional:      Appearance: She is obese.  HENT:     Head:  Normocephalic.  Eyes:     Conjunctiva/sclera: Conjunctivae normal.  Cardiovascular:     Rate and Rhythm: Normal rate and regular rhythm.     Pulses: Normal pulses.     Heart sounds: Normal heart sounds.  Pulmonary:     Effort: Pulmonary effort is normal.     Breath sounds: Normal breath sounds.  Abdominal:     General: Bowel sounds are normal.  Musculoskeletal:        General: Normal range of motion.  Skin:    General: Skin is warm.     Findings: Erythema and rash present.  Neurological:     Mental Status: She is alert.  Psychiatric:        Mood and Affect: Mood normal.     BP 132/79   Pulse 65   Temp 97.9 F (36.6 C)   Ht 5\' 3"  (1.6 m)   Wt 195 lb 3.2 oz (88.5 kg)   SpO2 99%   BMI 34.58 kg/m  Wt Readings from Last 3 Encounters:  11/26/19 195 lb 3.2 oz (88.5 kg)  08/31/19 193 lb 3.2 oz (87.6 kg)  04/24/19 189 lb (85.7 kg)    Health Maintenance Due  Topic Date Due  . Hepatitis C Screening  Never done  . DEXA SCAN  Never done  . PNA vac Low Risk Adult (1 of 2 - PCV13) Never done    There are no preventive care reminders to display for this patient.   Lab Results  Component Value Date   TSH 1.700 01/06/2018   Lab Results  Component Value Date   WBC 8.2 12/11/2018   HGB 13.3 12/11/2018   HCT 43.0 12/11/2018   MCV 99.1 12/11/2018   PLT 218 12/11/2018   Lab Results  Component Value Date   NA 139 12/11/2018   K 3.9 12/11/2018   CO2 25 12/11/2018   GLUCOSE 88 12/11/2018   BUN 15 12/11/2018   CREATININE 0.87 12/11/2018   BILITOT 1.4 (H) 01/14/2018   ALKPHOS 84 01/14/2018   AST 18 01/14/2018   ALT 20 01/14/2018   PROT 5.5 (L) 01/14/2018   ALBUMIN 3.0 (L) 01/14/2018   CALCIUM 9.1 12/11/2018   ANIONGAP 8 12/11/2018   Lab Results  Component Value Date   CHOL 175 07/08/2017   Lab Results  Component Value Date   HDL 58 07/08/2017   Lab Results  Component Value Date   LDLCALC 93 07/08/2017   Lab Results  Component Value Date   TRIG 120  07/08/2017   Lab Results  Component Value Date   CHOLHDL 3.0 07/08/2017   No results found for: HGBA1C  Assessment & Plan:   Problem List Items Addressed This Visit      Musculoskeletal and Integument   Yeast dermatitis    Patient presents to clinic today for bilateral under breasts yeast infection.  Patient reports skin rash and redness present in the last 7 to 14 days.  Patient reports problem is getting worse since onset.  Nothing has helped alleviate symptoms.  Skin is erythematous with scattered rash underneath the armpit all the way close to underarm. Started patient ketoconazole cream twice daily, miconazole nitrate 2% powder. Advised patient to keep area washed, clean and dry. Rx sent to pharmacy.      Relevant Medications   miconazole (MICOTIN) 2 % powder   ketoconazole (NIZORAL) 2 % cream     Other   Localized edema - Primary    Patient is a 79 year old female who is reporting edema bilateral lower extremity.  This is new for patient in the last 7 days.  Patient is not reporting any fever tiredness, shortness of breath, headache, or chest pain.  Patient has a history of atherosclerotic cardiovascular disease and has a pacemaker.  On assessment, trace edema lower extremities.  Provided education to patient advised patient to elevate feet above heart, compression socks, follow-up with worsening or uncontrolled symptoms. DME for compression socks sent to pharmacy.          Meds ordered this encounter  Medications  . miconazole (MICOTIN) 2 % powder    Sig: Apply topically as needed for itching.    Dispense:  70 g    Refill:  0    Order Specific Question:   Supervising Provider    Answer:   Raliegh Ip [[id number]]  . ketoconazole (NIZORAL) 2 % cream    Sig: Apply 1 application topically daily.    Dispense:  15 g    Refill:  0    Order Specific Question:   Supervising Provider    Answer:   Raliegh Ip [[id number]]     Daryll Drown, NP

## 2019-11-26 NOTE — Assessment & Plan Note (Signed)
Patient is a 79 year old female who is reporting edema bilateral lower extremity.  This is new for patient in the last 7 days.  Patient is not reporting any fever tiredness, shortness of breath, headache, or chest pain.  Patient has a history of atherosclerotic cardiovascular disease and has a pacemaker.  On assessment, trace edema lower extremities.  Provided education to patient advised patient to elevate feet above heart, compression socks, follow-up with worsening or uncontrolled symptoms. DME for compression socks sent to pharmacy.

## 2019-11-26 NOTE — Patient Instructions (Addendum)
Localized edema Patient is a 79 year old female who is reporting edema bilateral lower extremity.  This is new for patient in the last 7 days.  Patient is not reporting any fever tiredness, shortness of breath, headache, or chest pain.  Patient has a history of atherosclerotic cardiovascular disease and has a pacemaker.  On assessment, trace edema lower extremities.  Provided education to patient advised patient to elevate feet above heart, compression socks, follow-up with worsening or uncontrolled symptoms. DME for compression socks sent to pharmacy.  Yeast dermatitis Patient presents to clinic today for bilateral under breasts yeast infection.  Patient reports skin rash and redness present in the last 7 to 14 days.  Patient reports problem is getting worse since onset.  Nothing has helped alleviate symptoms.  Skin is erythematous with scattered rash underneath the armpit all the way close to underarm. Started patient ketoconazole cream twice daily, miconazole nitrate 2% powder. Advised patient to keep area washed, clean and dry. Rx sent to pharmacy.    Skin Yeast Infection  A skin yeast infection is a condition in which there is an overgrowth of yeast (candida) that normally lives on the skin. This condition usually occurs in areas of the skin that are constantly warm and moist, such as the armpits or the groin. What are the causes? This condition is caused by a change in the normal balance of the yeast and bacteria that live on the skin. What increases the risk? You are more likely to develop this condition if you:  Are obese.  Are pregnant.  Take birth control pills.  Have diabetes.  Take antibiotic medicines.  Take steroid medicines.  Are malnourished.  Have a weak body defense system (immune system).  Are 75 years of age or older.  Wear tight clothing. What are the signs or symptoms? The most common symptom of this condition is itchiness in the affected area. Other  symptoms include:  Red, swollen area of the skin.  Bumps on the skin. How is this diagnosed?  This condition is diagnosed with a medical history and physical exam.  Your health care provider may check for yeast by taking light scrapings of the skin to be viewed under a microscope. How is this treated? This condition is treated with medicine. Medicines may be prescribed or be available over the counter. The medicines may be:  Taken by mouth (orally).  Applied as a cream or powder to your skin. Follow these instructions at home:   Take or apply over-the-counter and prescription medicines only as told by your health care provider.  Maintain a healthy weight. If you need help losing weight, talk with your health care provider.  Keep your skin clean and dry.  If you have diabetes, keep your blood sugar under control.  Keep all follow-up visits as told by your health care provider. This is important. Contact a health care provider if:  Your symptoms go away and then return.  Your symptoms do not get better with treatment.  Your symptoms get worse.  Your rash spreads.  You have a fever or chills.  You have new symptoms.  You have new warmth or redness of your skin. Summary  A skin yeast infection is a condition in which there is an overgrowth of yeast (candida) that normally lives on the skin. This condition is caused by a change in the normal balance of the yeast and bacteria that live on the skin.  Take or apply over-the-counter and prescription medicines only as told  by your health care provider.  Keep your skin clean and dry.  Contact a health care provider if your symptoms do not get better with treatment. This information is not intended to replace advice given to you by your health care provider. Make sure you discuss any questions you have with your health care provider. Document Revised: 09/13/2017 Document Reviewed: 09/13/2017 Elsevier Patient Education  2020  [employer].

## 2019-11-26 NOTE — Assessment & Plan Note (Signed)
Patient presents to clinic today for bilateral under breasts yeast infection.  Patient reports skin rash and redness present in the last 7 to 14 days.  Patient reports problem is getting worse since onset.  Nothing has helped alleviate symptoms.  Skin is erythematous with scattered rash underneath the armpit all the way close to underarm. Started patient ketoconazole cream twice daily, miconazole nitrate 2% powder. Advised patient to keep area washed, clean and dry. Rx sent to pharmacy.

## 2020-02-11 ENCOUNTER — Ambulatory Visit (INDEPENDENT_AMBULATORY_CARE_PROVIDER_SITE_OTHER): Payer: Medicare Other

## 2020-02-11 DIAGNOSIS — I441 Atrioventricular block, second degree: Secondary | ICD-10-CM

## 2020-02-11 LAB — CUP PACEART REMOTE DEVICE CHECK
Battery Remaining Longevity: 121 mo
Battery Remaining Percentage: 95.5 %
Battery Voltage: 3.02 V
Brady Statistic AP VP Percent: 8.8 %
Brady Statistic AP VS Percent: 1 %
Brady Statistic AS VP Percent: 91 %
Brady Statistic AS VS Percent: 1 %
Brady Statistic RA Percent Paced: 8.7 %
Brady Statistic RV Percent Paced: 99 %
Date Time Interrogation Session: 20211003222401
Implantable Lead Implant Date: 20190906
Implantable Lead Implant Date: 20190906
Implantable Lead Location: 753859
Implantable Lead Location: 753860
Implantable Pulse Generator Implant Date: 20190906
Lead Channel Impedance Value: 510 Ohm
Lead Channel Impedance Value: 590 Ohm
Lead Channel Pacing Threshold Amplitude: 0.625 V
Lead Channel Pacing Threshold Amplitude: 1 V
Lead Channel Pacing Threshold Pulse Width: 0.5 ms
Lead Channel Pacing Threshold Pulse Width: 0.5 ms
Lead Channel Sensing Intrinsic Amplitude: 12 mV
Lead Channel Sensing Intrinsic Amplitude: 2.8 mV
Lead Channel Setting Pacing Amplitude: 1.25 V
Lead Channel Setting Pacing Amplitude: 1.625
Lead Channel Setting Pacing Pulse Width: 0.5 ms
Lead Channel Setting Sensing Sensitivity: 2.5 mV
Pulse Gen Model: 2272
Pulse Gen Serial Number: 9061887

## 2020-02-12 NOTE — Progress Notes (Signed)
Remote pacemaker transmission.   

## 2020-04-14 ENCOUNTER — Other Ambulatory Visit: Payer: Self-pay

## 2020-04-14 ENCOUNTER — Encounter: Payer: Self-pay | Admitting: Obstetrics & Gynecology

## 2020-04-14 ENCOUNTER — Ambulatory Visit (INDEPENDENT_AMBULATORY_CARE_PROVIDER_SITE_OTHER): Payer: Medicare Other | Admitting: Obstetrics & Gynecology

## 2020-04-14 VITALS — BP 161/83 | HR 74 | Ht 60.5 in | Wt 197.5 lb

## 2020-04-14 DIAGNOSIS — N993 Prolapse of vaginal vault after hysterectomy: Secondary | ICD-10-CM | POA: Diagnosis not present

## 2020-04-14 NOTE — Progress Notes (Signed)
Chief Complaint  Patient presents with  . possible uterine prolapse    Blood pressure (!) 161/83, pulse 74, height 5' 0.5" (1.537 m), weight 197 lb 8 oz (89.6 kg).  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  Vaginal Epithelial Abnormality Classification System: 0 0    No abnormalities 1    Epithelial erythema 2    Granulation tissue 3    Epithelial break or erosion, 1 cm or less 4    Epithelial break or erosion, 1 cm or greater   The pessary is removed, cleaned and replaced without difficulty.      ICD-10-CM   1. Vaginal vault prolapse after hysterectomy, Stage 3  N99.3    Fit for Milex ring with support #5 today     Holly Chang will be sen back in 1 months for continued follow up.  Lazaro Arms, MD  04/14/2020 10:35 AM

## 2020-04-23 ENCOUNTER — Encounter: Payer: Self-pay | Admitting: Internal Medicine

## 2020-04-23 ENCOUNTER — Other Ambulatory Visit: Payer: Self-pay

## 2020-04-23 ENCOUNTER — Ambulatory Visit (INDEPENDENT_AMBULATORY_CARE_PROVIDER_SITE_OTHER): Payer: Medicare Other | Admitting: Internal Medicine

## 2020-04-23 VITALS — BP 144/92 | HR 80 | Ht 60.0 in | Wt 197.0 lb

## 2020-04-23 DIAGNOSIS — R001 Bradycardia, unspecified: Secondary | ICD-10-CM | POA: Diagnosis not present

## 2020-04-23 DIAGNOSIS — Z950 Presence of cardiac pacemaker: Secondary | ICD-10-CM

## 2020-04-23 DIAGNOSIS — I441 Atrioventricular block, second degree: Secondary | ICD-10-CM | POA: Diagnosis not present

## 2020-04-23 LAB — CUP PACEART INCLINIC DEVICE CHECK
Battery Remaining Longevity: 122 mo
Battery Voltage: 3.02 V
Brady Statistic RA Percent Paced: 10 %
Brady Statistic RV Percent Paced: 99.97 %
Date Time Interrogation Session: 20211215114017
Implantable Lead Implant Date: 20190906
Implantable Lead Implant Date: 20190906
Implantable Lead Location: 753859
Implantable Lead Location: 753860
Implantable Pulse Generator Implant Date: 20190906
Lead Channel Impedance Value: 525 Ohm
Lead Channel Impedance Value: 625 Ohm
Lead Channel Pacing Threshold Amplitude: 0.375 V
Lead Channel Pacing Threshold Amplitude: 1 V
Lead Channel Pacing Threshold Pulse Width: 0.5 ms
Lead Channel Pacing Threshold Pulse Width: 0.5 ms
Lead Channel Sensing Intrinsic Amplitude: 12 mV
Lead Channel Sensing Intrinsic Amplitude: 3.6 mV
Lead Channel Setting Pacing Amplitude: 1.25 V
Lead Channel Setting Pacing Amplitude: 1.375
Lead Channel Setting Pacing Pulse Width: 0.5 ms
Lead Channel Setting Sensing Sensitivity: 2.5 mV
Pulse Gen Model: 2272
Pulse Gen Serial Number: 9061887

## 2020-04-23 MED ORDER — FUROSEMIDE 40 MG PO TABS: 40.0000 mg | ORAL_TABLET | Freq: Every day | ORAL | 11 refills | Status: AC

## 2020-04-23 NOTE — Patient Instructions (Signed)
Medication Instructions:  °Your physician recommends that you continue on your current medications as directed. Please refer to the Current Medication list given to you today. ° °Increase Lasix to 40 mg Daily  ° °*If you need a refill on your cardiac medications before your next appointment, please call your pharmacy* ° ° °Lab Work: °NONE  ° °If you have labs (blood work) drawn today and your tests are completely normal, you will receive your results only by: °• MyChart Message (if you have MyChart) OR °• A paper copy in the mail °If you have any lab test that is abnormal or we need to change your treatment, we will call you to review the results. ° ° °Testing/Procedures: °NONE  ° ° ° °Follow-Up: °At [facility], you and your health needs are our priority.  As part of our continuing mission to provide you with exceptional heart care, we have created designated Provider Care Teams.  These Care Teams include your primary Cardiologist (physician) and Advanced Practice Providers (APPs -  Physician Assistants and Nurse Practitioners) who all work together to provide you with the care you need, when you need it. ° °We recommend signing up for the patient portal called "MyChart".  Sign up information is provided on this After Visit Summary.  MyChart is used to connect with patients for Virtual Visits (Telemedicine).  Patients are able to view lab/test results, encounter notes, upcoming appointments, etc.  Non-urgent messages can be sent to your provider as well.   °To learn more about what you can do with MyChart, go to [email or web address].   ° °Your next appointment:   °1 year(s) ° °The format for your next appointment:   °In Person ° °Provider:   °Gregg Taylor, MD ° ° °Other Instructions °Thank you for choosing [facility]! ° ° ° °

## 2020-04-23 NOTE — Progress Notes (Signed)
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 She has hallucinations and confabulation. She denies chest pain or sob. No edema.  Allergies  Allergen Reactions  . Penicillins Hives     Current Outpatient Medications  Medication Sig Dispense Refill  . QUEtiapine (SEROQUEL) 25 MG tablet Take 25 mg by mouth 2 (two) times daily as needed.    Marland Kitchen UNABLE TO FIND Hyland's restful leg-1 tab every 4 hours prn for leg cramps    . furosemide (LASIX) 40 MG tablet Take 1 tablet (40 mg total) by mouth daily. 30 tablet 11   No current facility-administered medications for this visit.     Past Medical History:  Diagnosis Date  . H/O cardiovascular stress test    Normal in 2018, 09/2017  . 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 Presence of permanent cardiac pacemaker 01/13/2018  . Second degree AV block, Mobitz type II    a. s/p St. Jude PPM 01/2018.  . Stroke [facility]) 2018   Diagnosed by her cousin, who is a Engineer, civil (consulting). Did not see a doctor. Sx resolved.   . Symptomatic bradycardia     ROS:   All systems reviewed and negative except as noted in the HPI.   Past Surgical History:  Procedure Laterality Date  . ABDOMINAL HYSTERECTOMY    . CATARACT EXTRACTION W/ INTRAOCULAR LENS  IMPLANT, BILATERAL Bilateral   . INSERT / REPLACE / REMOVE PACEMAKER  01/13/2018  . PACEMAKER IMPLANT N/A 01/13/2018   Procedure: PACEMAKER IMPLANT;  Surgeon: Holly Maw, MD;  Location: [facility] INVASIVE CV LAB;  Service: Cardiovascular;  Laterality: N/A;     Family History  Problem Relation Age of Onset  . Pulmonary fibrosis Mother   . Heart disease Father   . Alcohol abuse Father   . Cancer Paternal Uncle        skin     Social History   Socioeconomic History  . Marital status: Divorced    Spouse name: Not on file  . Number of children: Not on file  . Years of education: Not on file  . Highest  education level: Not on file  Occupational History  . Not on file  Tobacco Use  . Smoking status: Never Smoker  . Smokeless tobacco: Never Used  Vaping Use  . Vaping Use: Never used  Substance and Sexual Activity  . Alcohol use: No  . Drug use: No  . Sexual activity: Not Currently    Birth control/protection: Surgical    Comment: hyst  Other Topics Concern  . Not on file  Social History Narrative  . Not on file   Social Determinants of Health   Financial Resource Strain: Not on file  Food Insecurity: Not on file  Transportation Needs: Not on file  Physical Activity: Not on file  Stress: Not on file  Social Connections: Not on file  Intimate Partner Violence: Not on file     BP (!) 144/92   Pulse 80   Ht 5' (1.524 m)   Wt 197 lb (89.4 kg)   SpO2 99%   BMI 38.47 kg/m   Physical Exam:  Well appearing NAD HEENT: Unremarkable Neck:  No JVD, no thyromegally Lymphatics:  No adenopathy Back:  No CVA tenderness Lungs:  Clear HEART:  Regular rate rhythm, no murmurs, no rubs, no clicks Abd:  soft, positive bowel sounds, no organomegally, no  rebound, no guarding Ext:  2 plus pulses, no edema, no cyanosis, no clubbing Skin:  No rashes no nodules Neuro:  CN II through XII intact, motor grossly intact  EKG - nsr with AV pacing  DEVICE  Normal device function.  See PaceArt for details.   Assess/Plan: 1. CHB - she is asymptomatic, s/p PPM insertion. 2. PPM - her St. Jude DDD PM is working normally. We will recheck in several months. 3. Obesity - with her dementia, we did not bother to encouraged weight loss.  Holly Gowda Mounir Skipper,MD

## 2020-05-12 ENCOUNTER — Ambulatory Visit (INDEPENDENT_AMBULATORY_CARE_PROVIDER_SITE_OTHER): Payer: Medicare Other

## 2020-05-12 DIAGNOSIS — I441 Atrioventricular block, second degree: Secondary | ICD-10-CM | POA: Diagnosis not present

## 2020-05-13 LAB — CUP PACEART REMOTE DEVICE CHECK
Battery Remaining Longevity: 126 mo
Battery Remaining Percentage: 95.5 %
Battery Voltage: 3.02 V
Brady Statistic AP VP Percent: 6.5 %
Brady Statistic AP VS Percent: 1 %
Brady Statistic AS VP Percent: 93 %
Brady Statistic AS VS Percent: 1 %
Brady Statistic RA Percent Paced: 6.4 %
Brady Statistic RV Percent Paced: 99 %
Date Time Interrogation Session: 20220103043913
Implantable Lead Implant Date: 20190906
Implantable Lead Implant Date: 20190906
Implantable Lead Location: 753859
Implantable Lead Location: 753860
Implantable Pulse Generator Implant Date: 20190906
Lead Channel Impedance Value: 490 Ohm
Lead Channel Impedance Value: 550 Ohm
Lead Channel Pacing Threshold Amplitude: 0.375 V
Lead Channel Pacing Threshold Amplitude: 0.75 V
Lead Channel Pacing Threshold Pulse Width: 0.5 ms
Lead Channel Pacing Threshold Pulse Width: 0.5 ms
Lead Channel Sensing Intrinsic Amplitude: 12 mV
Lead Channel Sensing Intrinsic Amplitude: 2.5 mV
Lead Channel Setting Pacing Amplitude: 1 V
Lead Channel Setting Pacing Amplitude: 1.375
Lead Channel Setting Pacing Pulse Width: 0.5 ms
Lead Channel Setting Sensing Sensitivity: 2.5 mV
Pulse Gen Model: 2272
Pulse Gen Serial Number: 9061887

## 2020-05-16 ENCOUNTER — Encounter: Payer: Self-pay | Admitting: Obstetrics & Gynecology

## 2020-05-16 ENCOUNTER — Ambulatory Visit (INDEPENDENT_AMBULATORY_CARE_PROVIDER_SITE_OTHER): Payer: Medicare Other | Admitting: Obstetrics & Gynecology

## 2020-05-16 ENCOUNTER — Other Ambulatory Visit: Payer: Self-pay

## 2020-05-16 VITALS — BP 136/81 | HR 82 | Wt 193.0 lb

## 2020-05-16 DIAGNOSIS — Z4689 Encounter for fitting and adjustment of other specified devices: Secondary | ICD-10-CM | POA: Diagnosis not present

## 2020-05-16 DIAGNOSIS — N993 Prolapse of vaginal vault after hysterectomy: Secondary | ICD-10-CM | POA: Diagnosis not present

## 2020-05-16 NOTE — Progress Notes (Signed)
    Chief Complaint  Patient presents with  . Pessary Check      Blood pressure 136/81, pulse 82, weight 193 lb (87.5 kg).  Coral Timme presents today for routine follow up related to her pessary.   She uses a Milex ring with support #5, it has been in only 1 month She reports no vaginal discharge and no vaginal bleeding   Likert scale(1 not bothersome -5 very bothersome)  :  1  Exam reveals no undue vaginal mucosal pressure of breakdown, no discharge and no vaginal bleeding.  Vaginal Epithelial Abnormality Classification System:   0 0    No abnormalities 1    Epithelial erythema 2    Granulation tissue 3    Epithelial break or erosion, 1 cm or less 4    Epithelial break or erosion, 1 cm or greater  The pessary is removed, cleaned and replaced without difficulty.      ICD-10-CM   1. Pessary maintenance, Milex ring with support #5, OF 04/14/20  Z46.89   2. Vaginal vault prolapse after hysterectomy, Stage 3  N99.3      Alezandra Egli will be sen back in 3 months for continued follow up.  Lazaro Arms, MD  05/16/2020 10:27 AM

## 2020-05-26 NOTE — Progress Notes (Signed)
Remote pacemaker transmission.   

## 2020-08-11 ENCOUNTER — Ambulatory Visit (INDEPENDENT_AMBULATORY_CARE_PROVIDER_SITE_OTHER): Payer: Medicare Other

## 2020-08-11 DIAGNOSIS — I441 Atrioventricular block, second degree: Secondary | ICD-10-CM | POA: Diagnosis not present

## 2020-08-12 LAB — CUP PACEART REMOTE DEVICE CHECK
Battery Remaining Longevity: 126 mo
Battery Remaining Percentage: 95.5 %
Battery Voltage: 3.01 V
Brady Statistic AP VP Percent: 9.5 %
Brady Statistic AP VS Percent: 1 %
Brady Statistic AS VP Percent: 90 %
Brady Statistic AS VS Percent: 1 %
Brady Statistic RA Percent Paced: 9.4 %
Brady Statistic RV Percent Paced: 99 %
Date Time Interrogation Session: 20220404020013
Implantable Lead Implant Date: 20190906
Implantable Lead Implant Date: 20190906
Implantable Lead Location: 753859
Implantable Lead Location: 753860
Implantable Pulse Generator Implant Date: 20190906
Lead Channel Impedance Value: 530 Ohm
Lead Channel Impedance Value: 580 Ohm
Lead Channel Pacing Threshold Amplitude: 0.375 V
Lead Channel Pacing Threshold Amplitude: 0.875 V
Lead Channel Pacing Threshold Pulse Width: 0.5 ms
Lead Channel Pacing Threshold Pulse Width: 0.5 ms
Lead Channel Sensing Intrinsic Amplitude: 12 mV
Lead Channel Sensing Intrinsic Amplitude: 2.4 mV
Lead Channel Setting Pacing Amplitude: 1.125
Lead Channel Setting Pacing Amplitude: 1.375
Lead Channel Setting Pacing Pulse Width: 0.5 ms
Lead Channel Setting Sensing Sensitivity: 2.5 mV
Pulse Gen Model: 2272
Pulse Gen Serial Number: 9061887

## 2020-08-14 ENCOUNTER — Ambulatory Visit (INDEPENDENT_AMBULATORY_CARE_PROVIDER_SITE_OTHER): Payer: Medicare Other | Admitting: Obstetrics & Gynecology

## 2020-08-14 ENCOUNTER — Other Ambulatory Visit: Payer: Self-pay

## 2020-08-14 ENCOUNTER — Encounter: Payer: Self-pay | Admitting: Obstetrics & Gynecology

## 2020-08-14 VITALS — BP 146/87 | HR 77 | Wt 194.0 lb

## 2020-08-14 DIAGNOSIS — N993 Prolapse of vaginal vault after hysterectomy: Secondary | ICD-10-CM

## 2020-08-14 DIAGNOSIS — Z4689 Encounter for fitting and adjustment of other specified devices: Secondary | ICD-10-CM

## 2020-08-14 NOTE — Progress Notes (Signed)
Chief Complaint  Patient presents with  . Pessary Check    Blood pressure (!) 146/87, pulse 77, weight 194 lb (88 kg).  Holly Chang presents today for routine follow up related to her pessary.   She uses a Milex ring with support #5 She reports no vaginal discharge and no vaginal bleeding   Likert scale(1 not bothersome -5 very bothersome)  :  1  Exam reveals no undue vaginal mucosal pressure of breakdown, no discharge and no vaginal bleeding.  Vaginal Epithelial Abnormality Classification System:   0 0    No abnormalities 1    Epithelial erythema 2    Granulation tissue 3    Epithelial break or erosion, 1 cm or less 4    Epithelial break or erosion, 1 cm or greater  The pessary is removed, cleaned and replaced without difficulty.    No diagnosis found.   [name] will be sen back in 4 months for continued follow up.  Lazaro Arms, MD  08/14/2020 10:15 AM

## 2020-08-21 NOTE — Progress Notes (Signed)
Remote pacemaker transmission.   

## 2020-11-11 ENCOUNTER — Ambulatory Visit (INDEPENDENT_AMBULATORY_CARE_PROVIDER_SITE_OTHER): Payer: Medicare Other

## 2020-11-11 DIAGNOSIS — I441 Atrioventricular block, second degree: Secondary | ICD-10-CM | POA: Diagnosis not present

## 2020-11-11 LAB — CUP PACEART REMOTE DEVICE CHECK
Battery Remaining Longevity: 91 mo
Battery Remaining Percentage: 73 %
Battery Voltage: 3.01 V
Brady Statistic AP VP Percent: 7.9 %
Brady Statistic AP VS Percent: 1 %
Brady Statistic AS VP Percent: 92 %
Brady Statistic AS VS Percent: 1 %
Brady Statistic RA Percent Paced: 7.7 %
Brady Statistic RV Percent Paced: 99 %
Date Time Interrogation Session: 20220705020031
Implantable Lead Implant Date: 20190906
Implantable Lead Implant Date: 20190906
Implantable Lead Location: 753859
Implantable Lead Location: 753860
Implantable Pulse Generator Implant Date: 20190906
Lead Channel Impedance Value: 490 Ohm
Lead Channel Impedance Value: 550 Ohm
Lead Channel Pacing Threshold Amplitude: 0.375 V
Lead Channel Pacing Threshold Amplitude: 0.75 V
Lead Channel Pacing Threshold Pulse Width: 0.5 ms
Lead Channel Pacing Threshold Pulse Width: 0.5 ms
Lead Channel Sensing Intrinsic Amplitude: 12 mV
Lead Channel Sensing Intrinsic Amplitude: 2.7 mV
Lead Channel Setting Pacing Amplitude: 1 V
Lead Channel Setting Pacing Amplitude: 1.375
Lead Channel Setting Pacing Pulse Width: 0.5 ms
Lead Channel Setting Sensing Sensitivity: 2.5 mV
Pulse Gen Model: 2272
Pulse Gen Serial Number: 9061887

## 2020-12-01 NOTE — Progress Notes (Signed)
Remote pacemaker transmission.   

## 2020-12-09 ENCOUNTER — Other Ambulatory Visit: Payer: Self-pay

## 2020-12-09 ENCOUNTER — Ambulatory Visit (INDEPENDENT_AMBULATORY_CARE_PROVIDER_SITE_OTHER): Payer: Medicare Other | Admitting: Obstetrics & Gynecology

## 2020-12-09 ENCOUNTER — Encounter: Payer: Self-pay | Admitting: Obstetrics & Gynecology

## 2020-12-09 VITALS — BP 137/71 | HR 81 | Ht 63.0 in | Wt 186.0 lb

## 2020-12-09 DIAGNOSIS — N763 Subacute and chronic vulvitis: Secondary | ICD-10-CM | POA: Diagnosis not present

## 2020-12-09 DIAGNOSIS — Z4689 Encounter for fitting and adjustment of other specified devices: Secondary | ICD-10-CM | POA: Diagnosis not present

## 2020-12-09 DIAGNOSIS — N993 Prolapse of vaginal vault after hysterectomy: Secondary | ICD-10-CM

## 2020-12-09 NOTE — Progress Notes (Signed)
Chief Complaint  Patient presents with   Pessary Check    Blood pressure 137/71, pulse 81, height 5\' 3"  (1.6 m), weight 186 lb (84.4 kg).  Holly Chang presents today for routine follow up related to her pessary.   She uses a Milex ring with support #5 She reports no vaginal discharge and no vaginal bleeding   Likert scale(1 not bothersome -5 very bothersome)  :  1  Exam reveals no undue vaginal mucosal pressure of breakdown, no discharge and no vaginal bleeding.  Vaginal Epithelial Abnormality Classification System:   0 0    No abnormalities 1    Epithelial erythema 2    Granulation tissue 3    Epithelial break or erosion, 1 cm or less 4    Epithelial break or erosion, 1 cm or greater  The pessary is removed, cleaned and replaced without difficulty.      ICD-10-CM   1. Pessary maintenance, Milex ring with support #5, OF 04/14/20  Z46.89     2. Vaginal vault prolapse after hysterectomy, Stage 3  N99.3     3. Chronic vulvitis  N76.3    recommend ZnO2 twice daily       Holly Chang will be sen back in 4 months for continued follow up.  [name], MD  12/09/2020 9:34 AM

## 2021-02-02 ENCOUNTER — Ambulatory Visit: Payer: Medicare Other | Admitting: Obstetrics & Gynecology

## 2021-02-07 DEATH — deceased

## 2021-04-07 ENCOUNTER — Ambulatory Visit: Payer: Medicare Other | Admitting: Obstetrics & Gynecology
